# Patient Record
Sex: Female | Born: 1965 | Race: White | Hispanic: No | Marital: Married | Smoking: Never smoker
Health system: Southern US, Community
[De-identification: ages and names within clinical notes are randomized; demographics above are authoritative.]

## PROBLEM LIST (undated history)

## (undated) DIAGNOSIS — I10 Essential (primary) hypertension: Secondary | ICD-10-CM

## (undated) DIAGNOSIS — N3010 Interstitial cystitis (chronic) without hematuria: Secondary | ICD-10-CM

## (undated) DIAGNOSIS — Z8639 Personal history of other endocrine, nutritional and metabolic disease: Secondary | ICD-10-CM

## (undated) HISTORY — DX: Interstitial cystitis (chronic) without hematuria: N30.10

## (undated) HISTORY — DX: Essential (primary) hypertension: I10

## (undated) HISTORY — PX: GALLBLADDER SURGERY: SHX652

## (undated) HISTORY — DX: Personal history of other endocrine, nutritional and metabolic disease: Z86.39

## (undated) HISTORY — PX: WISDOM TOOTH EXTRACTION: SHX21

---

## 1999-04-09 ENCOUNTER — Other Ambulatory Visit: Admission: RE | Admit: 1999-04-09 | Discharge: 1999-04-09 | Payer: Self-pay | Admitting: Gynecology

## 2000-04-22 ENCOUNTER — Other Ambulatory Visit: Admission: RE | Admit: 2000-04-22 | Discharge: 2000-04-22 | Payer: Self-pay | Admitting: Gynecology

## 2000-08-18 ENCOUNTER — Encounter: Admission: RE | Admit: 2000-08-18 | Discharge: 2000-11-16 | Payer: Self-pay | Admitting: Gynecology

## 2000-11-04 ENCOUNTER — Inpatient Hospital Stay (HOSPITAL_COMMUNITY): Admission: AD | Admit: 2000-11-04 | Discharge: 2000-11-06 | Payer: Self-pay | Admitting: Gynecology

## 2000-12-13 ENCOUNTER — Other Ambulatory Visit: Admission: RE | Admit: 2000-12-13 | Discharge: 2000-12-13 | Payer: Self-pay | Admitting: Gynecology

## 2002-02-16 ENCOUNTER — Other Ambulatory Visit: Admission: RE | Admit: 2002-02-16 | Discharge: 2002-02-16 | Payer: Self-pay | Admitting: Gynecology

## 2002-03-29 ENCOUNTER — Encounter: Payer: Self-pay | Admitting: Internal Medicine

## 2002-03-29 ENCOUNTER — Encounter: Admission: RE | Admit: 2002-03-29 | Discharge: 2002-03-29 | Payer: Self-pay | Admitting: Internal Medicine

## 2003-03-05 ENCOUNTER — Other Ambulatory Visit: Admission: RE | Admit: 2003-03-05 | Discharge: 2003-03-05 | Payer: Self-pay | Admitting: Gynecology

## 2003-06-16 ENCOUNTER — Emergency Department (HOSPITAL_COMMUNITY): Admission: EM | Admit: 2003-06-16 | Discharge: 2003-06-16 | Payer: Self-pay | Admitting: Emergency Medicine

## 2003-09-09 ENCOUNTER — Inpatient Hospital Stay (HOSPITAL_COMMUNITY): Admission: AD | Admit: 2003-09-09 | Discharge: 2003-09-11 | Payer: Self-pay | Admitting: Gynecology

## 2003-10-22 ENCOUNTER — Other Ambulatory Visit: Admission: RE | Admit: 2003-10-22 | Discharge: 2003-10-22 | Payer: Self-pay | Admitting: Gynecology

## 2004-01-08 ENCOUNTER — Encounter: Admission: RE | Admit: 2004-01-08 | Discharge: 2004-01-08 | Payer: Self-pay | Admitting: Internal Medicine

## 2004-04-14 ENCOUNTER — Other Ambulatory Visit: Admission: RE | Admit: 2004-04-14 | Discharge: 2004-04-14 | Payer: Self-pay | Admitting: Gynecology

## 2004-10-22 ENCOUNTER — Inpatient Hospital Stay (HOSPITAL_COMMUNITY): Admission: AD | Admit: 2004-10-22 | Discharge: 2004-10-24 | Payer: Self-pay | Admitting: Gynecology

## 2004-10-22 ENCOUNTER — Encounter (INDEPENDENT_AMBULATORY_CARE_PROVIDER_SITE_OTHER): Payer: Self-pay | Admitting: *Deleted

## 2004-12-04 ENCOUNTER — Other Ambulatory Visit: Admission: RE | Admit: 2004-12-04 | Discharge: 2004-12-04 | Payer: Self-pay | Admitting: Gynecology

## 2005-12-17 ENCOUNTER — Other Ambulatory Visit: Admission: RE | Admit: 2005-12-17 | Discharge: 2005-12-17 | Payer: Self-pay | Admitting: Gynecology

## 2007-03-17 ENCOUNTER — Other Ambulatory Visit: Admission: RE | Admit: 2007-03-17 | Discharge: 2007-03-17 | Payer: Self-pay | Admitting: Gynecology

## 2008-03-02 ENCOUNTER — Ambulatory Visit: Payer: Self-pay | Admitting: Gynecology

## 2008-04-11 ENCOUNTER — Encounter: Payer: Self-pay | Admitting: Women's Health

## 2008-04-11 ENCOUNTER — Other Ambulatory Visit: Admission: RE | Admit: 2008-04-11 | Discharge: 2008-04-11 | Payer: Self-pay | Admitting: Gynecology

## 2008-04-11 ENCOUNTER — Ambulatory Visit: Payer: Self-pay | Admitting: Women's Health

## 2008-09-18 ENCOUNTER — Ambulatory Visit (HOSPITAL_COMMUNITY): Admission: RE | Admit: 2008-09-18 | Discharge: 2008-09-18 | Payer: Self-pay | Admitting: Gastroenterology

## 2008-09-18 ENCOUNTER — Encounter (INDEPENDENT_AMBULATORY_CARE_PROVIDER_SITE_OTHER): Payer: Self-pay | Admitting: Gastroenterology

## 2009-04-12 ENCOUNTER — Ambulatory Visit: Payer: Self-pay | Admitting: Women's Health

## 2009-04-12 ENCOUNTER — Other Ambulatory Visit: Admission: RE | Admit: 2009-04-12 | Discharge: 2009-04-12 | Payer: Self-pay | Admitting: Gynecology

## 2009-07-15 ENCOUNTER — Ambulatory Visit (HOSPITAL_COMMUNITY): Admission: RE | Admit: 2009-07-15 | Discharge: 2009-07-15 | Payer: Self-pay | Admitting: Surgery

## 2009-08-22 ENCOUNTER — Ambulatory Visit: Payer: Self-pay | Admitting: Women's Health

## 2010-01-09 ENCOUNTER — Ambulatory Visit: Payer: Self-pay | Admitting: Women's Health

## 2010-02-03 ENCOUNTER — Emergency Department (HOSPITAL_COMMUNITY): Admission: EM | Admit: 2010-02-03 | Discharge: 2010-02-03 | Payer: Self-pay | Admitting: Family Medicine

## 2010-02-04 ENCOUNTER — Encounter: Admission: RE | Admit: 2010-02-04 | Discharge: 2010-02-04 | Payer: Self-pay | Admitting: Gynecology

## 2010-02-13 ENCOUNTER — Encounter: Admission: RE | Admit: 2010-02-13 | Discharge: 2010-02-13 | Payer: Self-pay | Admitting: Gynecology

## 2010-06-25 ENCOUNTER — Other Ambulatory Visit: Payer: Self-pay | Admitting: Women's Health

## 2010-06-25 ENCOUNTER — Encounter (INDEPENDENT_AMBULATORY_CARE_PROVIDER_SITE_OTHER): Payer: 59 | Admitting: Women's Health

## 2010-06-25 ENCOUNTER — Other Ambulatory Visit (HOSPITAL_COMMUNITY)
Admission: RE | Admit: 2010-06-25 | Discharge: 2010-06-25 | Disposition: A | Discharge status: Home / Self Care | Payer: 59 | Source: Ambulatory Visit | Attending: Gynecology | Admitting: Gynecology

## 2010-06-25 DIAGNOSIS — Z01419 Encounter for gynecological examination (general) (routine) without abnormal findings: Secondary | ICD-10-CM

## 2010-06-25 DIAGNOSIS — Z124 Encounter for screening for malignant neoplasm of cervix: Secondary | ICD-10-CM | POA: Insufficient documentation

## 2010-08-04 LAB — URINALYSIS, ROUTINE W REFLEX MICROSCOPIC
Bilirubin Urine: NEGATIVE
Glucose, UA: NEGATIVE mg/dL
Ketones, ur: NEGATIVE mg/dL
pH: 6.5 (ref 5.0–8.0)

## 2010-08-04 LAB — DIFFERENTIAL
Basophils Absolute: 0.1 10*3/uL (ref 0.0–0.1)
Basophils Relative: 2 % — ABNORMAL HIGH (ref 0–1)
Monocytes Relative: 6 % (ref 3–12)
Neutro Abs: 2.2 10*3/uL (ref 1.7–7.7)
Neutrophils Relative %: 53 % (ref 43–77)

## 2010-08-04 LAB — COMPREHENSIVE METABOLIC PANEL
Alkaline Phosphatase: 69 U/L (ref 39–117)
BUN: 11 mg/dL (ref 6–23)
CO2: 29 mEq/L (ref 19–32)
GFR calc non Af Amer: >60 mL/min (ref >60)
Glucose, Bld: 84 mg/dL (ref 70–99)
Potassium: 4 mEq/L (ref 3.5–5.1)
Total Bilirubin: 0.9 mg/dL (ref 0.3–1.2)
Total Protein: 7.5 g/dL (ref 6.0–8.3)

## 2010-08-04 LAB — LIPASE, BLOOD: Lipase: 35 U/L (ref 11–59)

## 2010-08-04 LAB — CBC
HCT: 42 % (ref 36.0–46.0)
Hemoglobin: 14.9 g/dL (ref 12.0–15.0)
RDW: 12 % (ref 11.5–15.5)

## 2010-09-23 NOTE — Op Note (Signed)
NAMEMARIELIS, Faith Miller          ACCOUNT NO.:  [id number]   MEDICAL RECORD NO.:  [id number]          PATIENT TYPE:  AMB   LOCATION:  ENDO                         FACILITY:  [facility]   PHYSICIAN:  Petra Kuba, M.D.    DATE OF BIRTH:  1966-05-07   DATE OF PROCEDURE:  09/18/2008  DATE OF DISCHARGE:                               OPERATIVE REPORT   PROCEDURE PERFORMED:  Colonoscopy with polypectomy.   ENDOSCOPIST:  Petra Kuba, M.D.   INDICATIONS FOR PROCEDURE:  Family history of both colon polyps and  colon cancer, with some rectal pain, probably hemorrhoids.  Consent was  signed after the risks, benefits, methods and options were thoroughly  discussed in the past.   MEDICINES USED:  Fentanyl 100 mcg, Versed 10 mg.   DESCRIPTION OF PROCEDURE:  Rectal inspection was pertinent for small  external hemorrhoids.  Digital exam was negative.  A video pediatric  colonoscope was inserted and on insertion, approximately in the splenic  flexure, a small to medium sized pedunculated polyp was seen, snared,  snared, electrocautery applied and the polyp was removed.  This site had  a nice white coagulum without any residual polyp.  The scope was then  advanced further up the colon leaving the polyp in that area in once  piece and in the midtransverse, a small polyp was seen, snared,  electrocautery applied and the polyp was removed, suctioned through the  scope and collected in the trap.  It was put in the first container.  The scope was then advanced easily to the cecum which was identified by  the appendiceal orifice and the ileocecal valve.  In fact, the scope was  inserted a short ways in the terminal ileum which was normal.  Photodocumentation was obtained.  The scope was slowly withdrawn.  The  prep was adequate.  There was some liquid stool that required washing  and suctioning.  On slow withdrawal through the colon, no other polypoid  lesions, masses, diverticula were seen as we slowly  withdrew back  through the colon.  Both polypectomy sites were seen without bleeding  and a nice white coagulum.  We then found the previously removed polyp  in the descending, grabbed it with the snare a few times and cut through  it, suctioned some of the pieces through it and then grabbed it with the  basket and pulled it through the scope.  We believe we recovered all the  pieces.  We decided to put all the polyp pieces in the same container  with the other small polyp.  Scope was further withdrawn.  No other left-  sided abnormalities were seen as we slowly withdrew back to the rectum.  Once back in the rectum, anorectal pullthrough and retroflexion  confirmed some small hemorrhoids.  Scope was straightened, readvanced a  short ways up the left side of the colon, air was suctioned, scope  removed.  The patient tolerated the procedure well.  There was no  immediate obvious complication.   ENDOSCOPIC DIAGNOSES:  1. Internal and external small hemorrhoids.  2. Approximately level of the splenic flexure, medium pedunculated  polyp status post snare and removed in pieces.  3. Small midtransverse polyp snared.  4. Otherwise within normal limits to the terminal ileum.   PLAN:  Await pathology but probably repeat colon screening in 3 to 5  years.  Happy to see back sooner p.r.n.           ______________________________  Petra Kuba, M.D.     MEM/MEDQ  D:  09/18/2008  T:  09/18/2008  Job:  [id number]

## 2010-09-26 NOTE — Discharge Summary (Signed)
[facility]  Patient:    Faith Miller, Faith Miller                   MRN: [id number] Adm. Date:  [id number] Disc. Date: [id number] Attending:  Tonye Royalty Dictator:   Antony Contras, N.P.                           Discharge Summary  DISCHARGE DIAGNOSES:          Intrauterine pregnancy at 36 weeks, spontaneous rupture of membranes, history of chronic hypertension, depression.  PROCEDURE:                    Vacuum assisted vaginal delivery of viable infant over midline episiotomy.  HISTORY OF PRESENT ILLNESS:   Patient is a 45 year old prima gravida with an EDC of December 03, 2000 by ultrasound.  Prenatal risk factors include history of chronic hypertension, depression.  Patient is on Aldomet 500 mg b.i.d. and Zoloft 100 mg q.d.  PRENATAL LABORATORIES:        Blood type O+.  Antibody screen negative. 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  RPR, HBSAG, HIV nonreactive.  Rubella immune. MSAFP normal.  HOSPITAL COURSE:              Patient presented on November 04, 2000 with spontaneous rupture of membranes at [redacted] weeks gestation.  At that time she was having irregular contractions.  She was admitted to labor and delivery and started on intravenous penicillin-G 5 milliunits for GBS prophylaxis.  Her blood pressure was normal.  Also, admitting laboratory work showed uric acid, LDH, CMET, and blood count all normal.  She did progress to complete dilatation.  Delivered per vacuum assisted vaginal delivery a viable 5 pound 10 ounce, Apgar 8/9 female infant over a midline episiotomy with third degree extension.  Placenta was intact.  Postpartum course patient remained afebrile. Had no difficulty voiding.  Was able to be discharged on her second postpartum day in satisfactory condition.  LABORATORIES:                 CBC:  Hematocrit 26.6, hemoglobin 9.3, WBC 10, platelets 164,000.  DISPOSITION:                  Follow-up in six weeks.  Continue prenatal vitamins and  iron, Motrin and Tylox for pain. DD:  11/26/00 TD:  11/27/00 Job: [id number]

## 2010-09-26 NOTE — H&P (Signed)
[facility]  Patient:    Faith Miller, Faith Miller                   MRN: [id number] Adm. Date:  11/04/00 Attending:  Gaetano Hawthorne. Lily Peer, M.D.                         History and Physical  CHIEF COMPLAINT:              Spontaneous rupture of membranes at approximately 0200 hours.  HISTORY:                      Patient is a 45 year old gravida 1, para 0 with a corrected estimated date of confinement based on early ultrasound of December 03, 2000, patient currently [redacted] weeks gestation and had spontaneous rupture of membranes at 0200 hours this morning [facility] with vital signs as follows:  Temperature 99.5, pulse 102, respirations 20 and blood pressure was 154/100; on repeat, the blood pressure was 136/78, pulse 67 and respirations 18.  She was found to be contracting very irregularly.  Fetal heart rate was 125 to 135 beats per minute.  She was admitted to labor and delivery.  She was started on penicillin G for GBS prophylaxis.  Patient with known history of chronic hypertension during her pregnancy and was on Aldomet 500 mg b.i.d. and also history of depression and was taking Zoloft 100 mg q.d.  She had otherwise an uneventful prenatal course with the exception of close monitoring of her blood pressures and 24-hour urine studies which were normal.  PAST MEDICAL HISTORY:         Patient has history of chronic hypertension and on Aldomet 500 mg b.i.d., history of depression and on Zoloft 100 mg q.d.  ALLERGIES:                    Patient is allergic to ASPIRIN.  FAMILY HISTORY:               See [employer] form.  REVIEW OF SYSTEMS:            See [employer] form.  PHYSICAL EXAMINATION:  VITAL SIGNS:                  As described above.  HEENT:                        Unremarkable.  NECK:                         Supple.  Trachea midline.  No carotid bruits or thyromegaly.  LUNGS:                         Clear to auscultation without rhonchi or wheezes.  HEART:                        Regular rate and rhythm without murmurs or gallops.  BREASTS:                      Exam not done.  ABDOMEN:                      Gravid uterus, vertex presentation by [employer].  PELVIC:  At time of this dictation, patient has reached complete dilatation, complete effacement and 0 to +1 station.  EXTREMITIES:                  DTRs 1+ and no clonus.  Trace edema.  PRENATAL LABORATORY DATA:     O-positive blood type.  Negative antibody screen.  Toxoplasmosis screen with evidence of old disease with old exposure, positive IgG but negative IgM.  VDRL was nonreactive.  Rubella with evidence of immunity.  Hepatitis B surface antigen and HIV were negative.  Pap smear was normal.  Alpha-fetoprotein was normal.  She had an elevated one-hour p.c. blood sugar with a three-hour GTT with one abnormal value.  GBS culture has not been done as of yet.  ASSESSMENT:                   [age]-year-old gravida 1, para 0 with preterm premature rupture of membranes at [redacted] weeks gestation at approximately 0200 hours, was admitted with irregular contractions at approximately 3 oclock this morning and now at 6 a.m., is completely dilated, 0 to +1 station, complete effacement and will to start to push.  She was started on penicillin G 5-million units intravenously followed by 2.5-million units intravenously q.4h. due to her prematurity and not knowing her group B streptococcus status.  Tracing has been reactive and her contractions are more regular, like every one to two minutes apart.  Anticipate a vaginal delivery shortly.  Her blood pressures have been normal and her admitting laboratory work consisting of uric acid, LDH, a comprehensive metabolic panel as well as complete blood count with all normal parameters.  PLAN:                         Per assessment above. DD:  11/04/00 TD:   11/04/00 Job: [id number]

## 2010-12-24 ENCOUNTER — Other Ambulatory Visit: Payer: Self-pay | Admitting: Dermatology

## 2011-06-29 ENCOUNTER — Encounter: Payer: Self-pay | Admitting: Women's Health

## 2011-06-29 ENCOUNTER — Ambulatory Visit (INDEPENDENT_AMBULATORY_CARE_PROVIDER_SITE_OTHER): Payer: Managed Care, Other (non HMO) | Admitting: Women's Health

## 2011-06-29 VITALS — BP 138/88 | Ht 62.5 in | Wt 155.0 lb

## 2011-06-29 DIAGNOSIS — F419 Anxiety disorder, unspecified: Secondary | ICD-10-CM

## 2011-06-29 DIAGNOSIS — I10 Essential (primary) hypertension: Secondary | ICD-10-CM

## 2011-06-29 DIAGNOSIS — Z01419 Encounter for gynecological examination (general) (routine) without abnormal findings: Secondary | ICD-10-CM

## 2011-06-29 DIAGNOSIS — F32A Depression, unspecified: Secondary | ICD-10-CM

## 2011-06-29 DIAGNOSIS — F341 Dysthymic disorder: Secondary | ICD-10-CM

## 2011-06-29 NOTE — Progress Notes (Signed)
Samanatha Miller Oct 06, 1965 [id number]    History:    The patient presents for annual exam.  5-6 day cycle every 30-40 days. Uses natural family planning and spermicide. History of normal Paps and mammograms. Problems of anxiety and depression is currently seeing a psychiatrist. Hypertension primary care labs and meds..  Past medical history, past surgical history, family history and social history were all reviewed and documented in the EPIC chart. Has 3 daughters 2 of which are struggling with school and has them in counseling.   ROS:  A  ROS was performed and pertinent positives and negatives are included in the history.  Exam:  Filed Vitals:   06/29/11 1005  BP: 138/88    General appearance:  Normal Head/Neck:  Normal, without cervical or supraclavicular adenopathy. Thyroid:  Symmetrical, normal in size, without palpable masses or nodularity. Respiratory  Effort:  Normal  Auscultation:  Clear without wheezing or rhonchi Cardiovascular  Auscultation:  Regular rate, without rubs, murmurs or gallops  Edema/varicosities:  Not grossly evident Abdominal  Soft,nontender, without masses, guarding or rebound.  Liver/spleen:  No organomegaly noted  Hernia:  None appreciated  Skin  Inspection:  Grossly normal  Palpation:  Grossly normal Neurologic/psychiatric  Orientation:  Normal with appropriate conversation.  Mood/affect:  Normal  Genitourinary    Breasts: Examined lying and sitting.     Right: Without masses, retractions, discharge or axillary adenopathy.     Left: Without masses, retractions, discharge or axillary adenopathy.   Inguinal/mons:  Normal without inguinal adenopathy  External genitalia:  Normal  BUS/Urethra/Skene's glands:  Normal  Bladder:  Normal  Vagina:  Normal  Cervix:  Normal  Uterus:   normal in size, shape and contour.  Midline and mobile  Adnexa/parametria:     Rt: Without masses or tenderness.   Lt: Without masses or tenderness.  Anus and  perineum: Normal  Digital rectal exam: Normal sphincter tone without palpated masses or tenderness  Assessment/Plan:  46 y.o. M WF G3 P3 for annual exam.   Anxiety/depression-psychiatrist /medications Normal GYN exam Hypertension diagnosed in 2012  Plan: Continue care with primary care and psychiatrist for medications and labs. SBE's, annual mammogram, encouraged increased exercise, calcium rich diet, MVI daily. Contraception options were reviewed and declined,  uses natural family planning with spermicides.   Harrington Challenger WHNP, 2:05 PM 06/29/2011

## 2011-12-25 ENCOUNTER — Other Ambulatory Visit: Payer: Self-pay | Admitting: Women's Health

## 2011-12-25 ENCOUNTER — Other Ambulatory Visit: Payer: Self-pay | Admitting: Gynecology

## 2011-12-25 DIAGNOSIS — Z1231 Encounter for screening mammogram for malignant neoplasm of breast: Secondary | ICD-10-CM

## 2011-12-30 ENCOUNTER — Ambulatory Visit
Admission: RE | Admit: 2011-12-30 | Discharge: 2011-12-30 | Disposition: A | Discharge status: Home / Self Care | Payer: Managed Care, Other (non HMO) | Source: Ambulatory Visit | Attending: Gynecology | Admitting: Gynecology

## 2011-12-30 DIAGNOSIS — Z1231 Encounter for screening mammogram for malignant neoplasm of breast: Secondary | ICD-10-CM

## 2012-03-25 ENCOUNTER — Telehealth: Payer: Self-pay | Admitting: *Deleted

## 2012-03-25 NOTE — Telephone Encounter (Signed)
Pt asked if you could call her she would like to speak with you about her irregular cycle. Call back # [phone].

## 2012-03-25 NOTE — Telephone Encounter (Signed)
Telephone call, states passed a small clot with some fibrous tissue, had a negative U PT. Reviewed normal, denies any cramping or heavy bleeding. Does natural family planning.

## 2012-04-20 ENCOUNTER — Encounter: Payer: Self-pay | Admitting: Cardiology

## 2012-04-20 ENCOUNTER — Ambulatory Visit (INDEPENDENT_AMBULATORY_CARE_PROVIDER_SITE_OTHER): Payer: PRIVATE HEALTH INSURANCE | Admitting: Cardiology

## 2012-04-20 VITALS — BP 145/88 | HR 68 | Ht 62.0 in | Wt 151.8 lb

## 2012-04-20 DIAGNOSIS — R002 Palpitations: Secondary | ICD-10-CM

## 2012-04-20 DIAGNOSIS — I10 Essential (primary) hypertension: Secondary | ICD-10-CM

## 2012-04-20 NOTE — Patient Instructions (Addendum)
Your physician has recommended that you wear an event monitor. Event monitors are medical devices that record the heart's electrical activity. Doctors most often [place] these monitors to diagnose arrhythmias. Arrhythmias are problems with the speed or rhythm of the heartbeat. The monitor is a small, portable device. You can wear one while you do your normal daily activities. This is usually used to diagnose what is causing palpitations/syncope (passing out).    Your physician recommends that you schedule a follow-up appointment in: 4 weeks with Dr. [place].

## 2012-04-20 NOTE — Progress Notes (Signed)
Nickolas Madrid Date of Birth: 10-30-1965 Medical Record #[id number]  History of Present Illness: Faith Miller is seen at the request of Dr. Ludwig Clarks for evaluation of palpitations. She is a pleasant 46 year old white female. She has a history of hypertension. She presents today with predominant complaints of palpitations. She states that she has had palpitations for a long time. She describes this as a shudder in her chest that may even elicit a cough. Over the past several months it has been more frequent. She was started on the Toprol 6 months ago for hypertension. On medication she states that her palpitations are actually worse. She feels more of a pounding sensation and feels that her heart is flipping over. It is more frequent. It is more prominent at night. She also feels fatigued on her beta blocker. Last year she did have some atypical chest pain. She had a echocardiogram performed at Dr. Verl Dicker office. This demonstrated normal LV function. There was mild mitral insufficiency with a structurally normal valve. She apparently was scheduled for stress test but dose was canceled when her blood pressure was too high. She has had no further chest pain since then.  Current Outpatient Prescriptions on File Prior to Visit  Medication Sig Dispense Refill  . ALBUTEROL IN Inhale into the lungs.      . ALPRAZolam (XANAX) 0.25 MG tablet Take 0.25 mg by mouth at bedtime as needed.      . metoprolol succinate (TOPROL-XL) 50 MG 24 hr tablet Take 50 mg by mouth daily. Take with or immediately following a meal.      . Montelukast Sodium (SINGULAIR PO) Take by mouth.      Marland Kitchen PRESCRIPTION MEDICATION ASTHMANEX      . sertraline (ZOLOFT) 25 MG tablet Take 25 mg by mouth daily.        Allergies  Allergen Reactions  . Aspirin Other (See Comments)    Asthma     Past Medical History  Diagnosis Date  . History of elevated lipids   . Anxiety     OBSESSIONS  . Asthma   . Hypertension     Past Surgical  History  Procedure Date  . Wisdom tooth extraction   . Gallbladder surgery     History  Smoking status  . Never Smoker   Smokeless tobacco  . Never Used    History  Alcohol Use  . Yes    Comment: RARELY    Family History  Problem Relation Age of Onset  . Hypertension Mother   . Heart disease Mother   . Hypertension Father   . Heart disease Father   . Breast cancer Maternal Grandmother   . Cancer Paternal Grandmother     COLON    Review of Systems: As noted in history of present illness.  All other systems were reviewed and are negative.  Physical Exam: BP 145/88  Pulse 68  Ht 5\' 2"  (1.575 m)  Wt 151 lb 12.8 oz (68.856 kg)  BMI 27.76 kg/m2 She is a pleasant white female in no acute distress.The patient is alert and oriented x 3.  The mood and affect are normal.  The skin is warm and dry.  Color is normal.  The HEENT exam reveals that the sclera are nonicteric.  The mucous membranes are moist.  The carotids are 2+ without bruits.  There is no thyromegaly.  There is no JVD.  The lungs are clear.  The chest wall is non tender.  The heart exam reveals a  regular rate with a normal S1 and S2.  There is a grade 1-2/6 systolic ejection murmur at the apex radiating to the right upper sternal border.  The PMI is not displaced.   Abdominal exam reveals good bowel sounds.  There is no guarding or rebound.  There is no hepatosplenomegaly or tenderness.  There are no masses.  Exam of the legs reveal no clubbing, cyanosis, or edema.  The legs are without rashes.  The distal pulses are intact.  Cranial nerves II - XII are intact.  Motor and sensory functions are intact.  The gait is normal.  LABORATORY DATA: ECG demonstrates normal sinus rhythm with a normal ECG. Rate is 69 beats per minute.  Assessment / Plan: 1. Palpitations. Symptoms are most consistent with PACs or PVCs. These are likely to be benign but the patient is very anxious about this. We will have her wear an event monitor to  try addendum for her arrhythmia. This will allow [place] to counsel her better. I'll followup again in 4 weeks. Since her symptoms have not improved on metoprolol and in fact are more prominent and may want to consider alternative blood pressure medication.  2. Murmur. She has only mild mitral insufficiency by echo. I suspect that her murmur is more of a flow murmur.  3. Hypertension.

## 2012-04-28 ENCOUNTER — Encounter: Payer: PRIVATE HEALTH INSURANCE | Admitting: *Deleted

## 2012-04-28 DIAGNOSIS — R002 Palpitations: Secondary | ICD-10-CM

## 2012-04-28 NOTE — Progress Notes (Unsigned)
Placed event monitor on Pt 04/28/12. TK

## 2012-05-09 ENCOUNTER — Telehealth: Payer: Self-pay | Admitting: *Deleted

## 2012-05-09 NOTE — Telephone Encounter (Signed)
Event monitor was placed on Pt. 04/28/12. TK

## 2012-06-03 ENCOUNTER — Ambulatory Visit (INDEPENDENT_AMBULATORY_CARE_PROVIDER_SITE_OTHER): Payer: PRIVATE HEALTH INSURANCE | Admitting: Cardiology

## 2012-06-03 ENCOUNTER — Encounter: Payer: Self-pay | Admitting: Cardiology

## 2012-06-03 VITALS — BP 140/90 | HR 70 | Resp 18 | Ht 62.0 in | Wt 151.4 lb

## 2012-06-03 DIAGNOSIS — R002 Palpitations: Secondary | ICD-10-CM

## 2012-06-03 DIAGNOSIS — I10 Essential (primary) hypertension: Secondary | ICD-10-CM

## 2012-06-03 NOTE — Patient Instructions (Signed)
Continue your current therapy  I will see you as needed. 

## 2012-06-03 NOTE — Progress Notes (Signed)
   Nickolas Madrid Date of Birth: 1966/01/02 Medical Record #[id number]  History of Present Illness: Faith Miller is seen  for followup of her palpitations. Since her last visit these have actually improved significantly. She reports only one episode of flip flopping in her chest and this occurred the day after she sent her event monitor in. While wearing the event monitor she had a total of 6 recordings all of which were normal sinus rhythm without ectopy.  Current Outpatient Prescriptions on File Prior to Visit  Medication Sig Dispense Refill  . ALBUTEROL IN Inhale into the lungs.      . ALPRAZolam (XANAX) 0.25 MG tablet Take 0.25 mg by mouth at bedtime as needed.      . metoprolol succinate (TOPROL-XL) 50 MG 24 hr tablet Take 50 mg by mouth daily. Take with or immediately following a meal.      . Montelukast Sodium (SINGULAIR PO) Take by mouth.      Marland Kitchen PRESCRIPTION MEDICATION ASTHMANEX        Allergies  Allergen Reactions  . Aspirin Other (See Comments)    Asthma     Past Medical History  Diagnosis Date  . History of elevated lipids   . Anxiety     OBSESSIONS  . Asthma   . Hypertension     Past Surgical History  Procedure Date  . Wisdom tooth extraction   . Gallbladder surgery     History  Smoking status  . Never Smoker   Smokeless tobacco  . Never Used    History  Alcohol Use  . Yes    Comment: RARELY    Family History  Problem Relation Age of Onset  . Hypertension Mother   . Heart disease Mother   . Hypertension Father   . Heart disease Father   . Breast cancer Maternal Grandmother   . Cancer Paternal Grandmother     COLON    Review of Systems: As noted in history of present illness.  All other systems were reviewed and are negative.  Physical Exam: BP 140/90  Pulse 70  Resp 18  Ht 5\' 2"  (1.575 m)  Wt 151 lb 6.4 oz (68.675 kg)  BMI 27.69 kg/m2  SpO2 97% She is a pleasant white female in no acute distress.The patient is alert and oriented x 3.    The HEENT exam is normal. The carotids are 2+ without bruits.  There is no thyromegaly.  There is no JVD.  The lungs are clear.   The heart exam reveals a regular rate with a normal S1 and S2.  There is a grade 1-2/6 systolic ejection murmur at the apex radiating to the right upper sternal border.  The PMI is not displaced.    LABORATORY DATA:   Assessment / Plan: 1. Palpitations. No significant arrhythmias noted on event monitor. She may be having intermittent PACs or PVCs but her symptoms are very infrequent now. She has normal LV function. I have reassured her that her findings are benign and do not carry any additional risk. I would just try to avoid cardiac stimulants such as caffeine. She will remain on Toprol for blood pressure control. I will see her back as needed.  2. Murmur. She has only mild mitral insufficiency by echo. I suspect that her murmur is more of a flow murmur.  3. Hypertension.

## 2012-06-27 ENCOUNTER — Ambulatory Visit (INDEPENDENT_AMBULATORY_CARE_PROVIDER_SITE_OTHER): Payer: PRIVATE HEALTH INSURANCE | Admitting: Women's Health

## 2012-06-27 DIAGNOSIS — N899 Noninflammatory disorder of vagina, unspecified: Secondary | ICD-10-CM

## 2012-06-27 DIAGNOSIS — N898 Other specified noninflammatory disorders of vagina: Secondary | ICD-10-CM

## 2012-06-27 LAB — WET PREP FOR TRICH, YEAST, CLUE
Trich, Wet Prep: NONE SEEN
Yeast Wet Prep HPF POC: NONE SEEN

## 2012-06-27 NOTE — Progress Notes (Signed)
Patient ID: Faith Miller, female   DOB: [birth year], 47 y.o.   MRN: [id number] Presents with complaint of external vaginal irritation and questionable bump. Denies discharge, odor, urinary symptoms, abdominal pain or fever. Regular monthly cycle/natural family planning. Has been out of work since August, starting a new job next week.  Exam: Appears well, slightly anxious. External genitalia minimal erythema externally, no visible blisters. Speculum exam no discharge noted, wet prep negative. Bimanual no CMT or adnexal fullness or tenderness.  Normal exam  Plan: Normal vaginal anatomy reviewed. Congratulated on new job, reviewed possible anxiety causing symptoms. Loose clothes, instructed to call if symptoms persist. Declines other contraception.

## 2013-01-05 ENCOUNTER — Encounter: Payer: Self-pay | Admitting: Women's Health

## 2013-01-11 ENCOUNTER — Telehealth: Payer: Self-pay | Admitting: Cardiology

## 2013-01-11 NOTE — Telephone Encounter (Signed)
New Problem  Pt states that for the past week she has been stressed and anxious and has noticed skipped beats// pt wants to make sure that she is not at risk or that there isn't anything that she needs to be concerned about.

## 2013-01-11 NOTE — Telephone Encounter (Signed)
Returned call to patient [name] advised to stop caffeine,may take metoprolol succ. 25 mg twice a day.Advised to call back if needed.

## 2013-01-11 NOTE — Telephone Encounter (Signed)
Returned call to patient she stated last week she has had more skipped beats.Stated she has been under more stress and drinking more caffeine.Stated she was concerned and wanted to make sure this is nothing to worry about.Stated in 7/14 she decreased metoprolol succ to 25 mg daily and it was controlling skipped beats.Stated she did call her PCP and was advised to increase metoprolol to 25 mg twice a day.Stated she wanted to check with [name] and get his advice.

## 2013-01-16 ENCOUNTER — Encounter: Payer: Self-pay | Admitting: Women's Health

## 2013-01-16 ENCOUNTER — Ambulatory Visit (INDEPENDENT_AMBULATORY_CARE_PROVIDER_SITE_OTHER): Payer: 59 | Admitting: Women's Health

## 2013-01-16 ENCOUNTER — Other Ambulatory Visit (HOSPITAL_COMMUNITY)
Admission: RE | Admit: 2013-01-16 | Discharge: 2013-01-16 | Disposition: A | Discharge status: Home / Self Care | Payer: PRIVATE HEALTH INSURANCE | Source: Ambulatory Visit | Attending: Gynecology | Admitting: Gynecology

## 2013-01-16 VITALS — BP 126/86 | Ht 62.0 in | Wt 144.8 lb

## 2013-01-16 DIAGNOSIS — Z01419 Encounter for gynecological examination (general) (routine) without abnormal findings: Secondary | ICD-10-CM

## 2013-01-16 DIAGNOSIS — F411 Generalized anxiety disorder: Secondary | ICD-10-CM

## 2013-01-16 MED ORDER — ALPRAZOLAM 0.25 MG PO TABS: 0.2500 mg | ORAL_TABLET | Freq: Every evening | ORAL | Status: DC | PRN

## 2013-01-16 NOTE — Progress Notes (Signed)
Faith Miller [birth year] [id number]    History:    The patient presents for annual exam.  Cycles are mostly monthly/natural family planning declines other contraception. Normal Pap and mammogram history. Atypical nevus on her back, annual skin checks.   Past medical history, past surgical history, family history and social history were all reviewed and documented in the EPIC chart. Works for [employer]. 3 daughters Isabelle Course seventh grade, Samara Deist fourth grade, Duwayne Heck third-grade all doing well. Has had some problems with anxiety and that sessions in the past currently on no medication and denies need. Uses an occasional Xanax. Cholecystectomy. Parents hypertension and heart disease, maternal grandmother breast cancer.   ROS:  A  ROS was performed and pertinent positives and negatives are included in the history.  Exam:  Filed Vitals:   01/16/13 1518  BP: 126/86    General appearance:  Normal Head/Neck:  Normal, without cervical or supraclavicular adenopathy. Thyroid:  Symmetrical, normal in size, without palpable masses or nodularity. Respiratory  Effort:  Normal  Auscultation:  Clear without wheezing or rhonchi Cardiovascular  Auscultation:  Regular rate, without rubs, murmurs or gallops  Edema/varicosities:  Not grossly evident Abdominal  Soft,nontender, without masses, guarding or rebound.  Liver/spleen:  No organomegaly noted  Hernia:  None appreciated  Skin  Inspection:  Grossly normal  Palpation:  Grossly normal Neurologic/psychiatric  Orientation:  Normal with appropriate conversation.  Mood/affect:  Normal  Genitourinary    Breasts: Examined lying and sitting.     Right: Without masses, retractions, discharge or axillary adenopathy.     Left: Without masses, retractions, discharge or axillary adenopathy.   Inguinal/mons:  Normal without inguinal adenopathy  External genitalia:  Normal  BUS/Urethra/Skene's glands:  Normal  Bladder:  Normal  Vagina:   Normal  Cervix:  Normal  Uterus:   normal in size, shape and contour.  Midline and mobile  Adnexa/parametria:     Rt: Without masses or tenderness.   Lt: Without masses or tenderness.  Anus and perineum: Normal  Digital rectal exam: Normal sphincter tone without palpated masses or tenderness  Assessment/Plan:  47 y.o. MWF G3P3 for annual exam with no complaints.  Normal GYN exam natural family planning Atypical nevus/annual skin checks with dermatologist Hypertension primary care manages labs and meds  Anxiety/depression with up sessions currently on no medication  Plan: SBE's, continue annual mammogram, calcium rich diet, regular exercise and healthy diet encouraged. Pap, Pap normal 2012, new screening guidelines reviewed. Contraception options reviewed, declines. Declines need for daily medication for anxiety and depression. Xanax 0.25, prescription, proper use, aware to use sparingly and addictive properties.   Harrington Challenger [facility], 3:58 PM 01/16/2013

## 2013-01-16 NOTE — Addendum Note (Signed)
Addended by: Richardson Chiquito on: 01/16/2013 05:10 PM   Modules accepted: Orders

## 2013-01-16 NOTE — Patient Instructions (Addendum)

## 2013-01-17 ENCOUNTER — Encounter: Payer: Self-pay | Admitting: Gynecology

## 2013-02-01 ENCOUNTER — Telehealth: Payer: Self-pay | Admitting: *Deleted

## 2013-02-01 NOTE — Telephone Encounter (Signed)
Pt said she removed her tampon and she believes some fibers were left inside vaginally. Pt will check vaginally with finger to see if she can feel anything. If any symptoms of fever or headaches, vomiting should occur pt will make OV.

## 2013-02-02 ENCOUNTER — Ambulatory Visit (INDEPENDENT_AMBULATORY_CARE_PROVIDER_SITE_OTHER): Payer: 59 | Admitting: Women's Health

## 2013-02-02 ENCOUNTER — Encounter: Payer: Self-pay | Admitting: Women's Health

## 2013-02-02 DIAGNOSIS — N907 Vulvar cyst: Secondary | ICD-10-CM

## 2013-02-02 DIAGNOSIS — N9089 Other specified noninflammatory disorders of vulva and perineum: Secondary | ICD-10-CM

## 2013-02-02 NOTE — Progress Notes (Signed)
Patient ID: Faith Miller, female   DOB: Jun 24, 1965, 47 y.o.   MRN: [id number] Presents with concern of small white bump left inner labia. Used a tampon yesterday and when removed thought that a piece of it was left behind in vagina. Then noticed some irritation from tampons string and small cyst which was painful to the touch. Denies fever, vaginal or urinary symptoms.  Exam: Appears well, somewhat anxious, external genitalia slightly irritated at introitus, small .5 cm sebaceous cyst on left upper inner labia. Speculum exam: No foreign body noted, no erythema or discharge. Bimanual exam: No CMT, no adnexal tenderness or fullness, no pain with exam.  Small sebaceous cyst Anxiety  Plan: Reassured it is benign and no treatment needed. Recommended soaking in warm bath. Advised to call office if symptoms worsen.

## 2013-02-08 ENCOUNTER — Telehealth: Payer: Self-pay | Admitting: *Deleted

## 2013-02-08 NOTE — Telephone Encounter (Signed)
Soaking in a warm tub, loose clothes, Neosporin to area may also help.

## 2013-02-08 NOTE — Telephone Encounter (Signed)
Pt called to follow up from OV 02/02/13, pt said cyst is starting to hurt off and on, looks like the size may have changed just a little. Pt said she has not tried soaking in warm bath, but will. Pt would like to know if the above is normal? Please advise

## 2013-02-08 NOTE — Telephone Encounter (Signed)
Pt informed with the below note. 

## 2013-06-21 ENCOUNTER — Telehealth: Payer: Self-pay | Admitting: Cardiology

## 2013-06-21 NOTE — Telephone Encounter (Signed)
Returned phone call to patient [name] advised does not need antibiotics before dental work.

## 2013-06-21 NOTE — Telephone Encounter (Signed)
New problem   Pt need to know do she need to take antibiotic before dental procedure. Please call pt.

## 2013-07-26 ENCOUNTER — Ambulatory Visit (INDEPENDENT_AMBULATORY_CARE_PROVIDER_SITE_OTHER): Payer: 59 | Admitting: Women's Health

## 2013-07-26 ENCOUNTER — Encounter: Payer: Self-pay | Admitting: Women's Health

## 2013-07-26 DIAGNOSIS — N898 Other specified noninflammatory disorders of vagina: Secondary | ICD-10-CM

## 2013-07-26 LAB — WET PREP FOR TRICH, YEAST, CLUE
CLUE CELLS WET PREP: NONE SEEN
TRICH WET PREP: NONE SEEN
YEAST WET PREP: NONE SEEN

## 2013-07-26 NOTE — Progress Notes (Signed)
Patient ID: Faith Miller, female   DOB: [birth year], 48 y.o.   MRN: [id number] Presents with complaint of vaginal irritation, mostly left labia with scant white discharge.  States has vaginal irritation most months after her cycle. Uses Aquaphor or over-the-counter Monistat if having itching.  Monthly cycle/rhythm method. Increased stress, daughter with food allergies had to be hospitalized last week. Struggles with anxiety and depression.  Exam: Appears well/anxious. External genitalia erythematous on left labia. Speculum exam, scant discharge, minimal erythema, wet prep negative. Bimanual no CMT or adnexal fullness or tenderness.  Left labia irritation  Plan: Loose clothes, keep open to air as able,  A and D. ointment. Reassurance given, instructed to call if no relief.

## 2013-07-27 ENCOUNTER — Telehealth: Payer: Self-pay | Admitting: Cardiology

## 2013-07-27 NOTE — Telephone Encounter (Signed)
Received fax from Dr.Adornetto's office will ask [name] 07/28/13 if patient needs antibiotic for periodontal dental work.

## 2013-07-27 NOTE — Telephone Encounter (Signed)
New message     It is OK for pt to have peridontial dental work or will she need an antibiotic prior?

## 2013-07-28 NOTE — Telephone Encounter (Signed)
[name] advised patient does not need antibiotics for dental work.Note faxed to Dr.Adornetto at fax # [phone].

## 2013-09-04 ENCOUNTER — Other Ambulatory Visit: Payer: Self-pay

## 2013-09-04 DIAGNOSIS — Z1231 Encounter for screening mammogram for malignant neoplasm of breast: Secondary | ICD-10-CM

## 2013-09-14 ENCOUNTER — Ambulatory Visit
Admission: RE | Admit: 2013-09-14 | Discharge: 2013-09-14 | Disposition: A | Discharge status: Home / Self Care | Payer: 59 | Source: Ambulatory Visit

## 2013-09-14 DIAGNOSIS — Z1231 Encounter for screening mammogram for malignant neoplasm of breast: Secondary | ICD-10-CM

## 2013-12-01 ENCOUNTER — Telehealth: Payer: Self-pay | Admitting: Cardiology

## 2013-12-01 NOTE — Telephone Encounter (Signed)
Returned call to patient [name] advised ok to complete medrol dose pk,,should not cause any cardiac problems.

## 2013-12-01 NOTE — Telephone Encounter (Signed)
Returned call to patient she stated she started Medrol dose pack 11/30/13 for tooth inflammation.stated she will finish medrol on Monday 12/04/13.She was reading side effects and wanted to make sure ok to take.Stated has noticed more palpitations since she started taking.Stated she is taking metoprolol succ 50 mg daily.Message sent to [place] for advice.

## 2013-12-01 NOTE — Telephone Encounter (Signed)
°  Patients PCP put patient on 6 day steroid dose pak for tooth inflammation. She is on day 3 of the dose pak. Her PCP is out of town. She is having a few palpitations nothing serious and wants to make sure that she is not creating a bigger problem by taking this medication. Please call and advise.

## 2013-12-01 NOTE — Telephone Encounter (Signed)
I think its OK to complete medrol dose. I don't think this will cause any cardiac problems.  Jamyria Ozanich [place] MD, [facility]

## 2014-01-17 ENCOUNTER — Encounter: Payer: 59 | Admitting: Women's Health

## 2014-01-23 ENCOUNTER — Encounter (HOSPITAL_COMMUNITY): Payer: Self-pay | Admitting: Emergency Medicine

## 2014-01-23 ENCOUNTER — Emergency Department (HOSPITAL_COMMUNITY)
Admission: EM | Admit: 2014-01-23 | Discharge: 2014-01-23 | Disposition: A | Discharge status: Home / Self Care | Payer: 59 | Source: Home / Self Care | Attending: Family Medicine | Admitting: Family Medicine

## 2014-01-23 DIAGNOSIS — J029 Acute pharyngitis, unspecified: Secondary | ICD-10-CM

## 2014-01-23 LAB — POCT RAPID STREP A: Streptococcus, Group A Screen (Direct): NEGATIVE

## 2014-01-23 NOTE — ED Notes (Signed)
C/o sore throat.  On set last night.  Mild post nasal drip this a.m.  Denies fever, n/v/d.   Has appointment schedule for dentist today wanted to make sure doesn't have strep.

## 2014-01-23 NOTE — Discharge Instructions (Signed)
Your blood pressure is a bit elevated here today. Please have this re-checked by your primary care physician in 7-10 days. Rapid strep screen negative Pharyngitis Pharyngitis is redness, pain, and swelling (inflammation) of your pharynx.  CAUSES  Pharyngitis is usually caused by infection. Most of the time, these infections are from viruses (viral) and are part of a cold. However, sometimes pharyngitis is caused by bacteria (bacterial). Pharyngitis can also be caused by allergies. Viral pharyngitis may be spread from person to person by coughing, sneezing, and personal items or utensils (cups, forks, spoons, toothbrushes). Bacterial pharyngitis may be spread from person to person by more intimate contact, such as kissing.  SIGNS AND SYMPTOMS  Symptoms of pharyngitis include:   Sore throat.   Tiredness (fatigue).   Low-grade fever.   Headache.  Joint pain and muscle aches.  Skin rashes.  Swollen lymph nodes.  Plaque-like film on throat or tonsils (often seen with bacterial pharyngitis). DIAGNOSIS  Your health care provider will ask you questions about your illness and your symptoms. Your medical history, along with a physical exam, is often all that is needed to diagnose pharyngitis. Sometimes, a rapid strep test is done. Other lab tests may also be done, depending on the suspected cause.  TREATMENT  Viral pharyngitis will usually get better in 3-4 days without the use of medicine. Bacterial pharyngitis is treated with medicines that kill germs (antibiotics).  HOME CARE INSTRUCTIONS   Drink enough water and fluids to keep your urine clear or pale yellow.   Only take over-the-counter or prescription medicines as directed by your health care provider:   If you are prescribed antibiotics, make sure you finish them even if you start to feel better.   Do not take aspirin.   Get lots of rest.   Gargle with 8 oz of salt water ( tsp of salt per 1 qt of water) as often as every  1-2 hours to soothe your throat.   Throat lozenges (if you are not at risk for choking) or sprays may be used to soothe your throat. SEEK MEDICAL CARE IF:   You have large, tender lumps in your neck.  You have a rash.  You cough up green, yellow-brown, or bloody spit. SEEK IMMEDIATE MEDICAL CARE IF:   Your neck becomes stiff.  You drool or are unable to swallow liquids.  You vomit or are unable to keep medicines or liquids down.  You have severe pain that does not go away with the use of recommended medicines.  You have trouble breathing (not caused by a stuffy nose). MAKE SURE YOU:   Understand these instructions.  Will watch your condition.  Will get help right away if you are not doing well or get worse. Document Released: 04/27/2005 Document Revised: 02/15/2013 Document Reviewed: 01/02/2013 [facility] Patient Information 2015 [place], [place]. This information is not intended to replace advice given to you by your health care provider. Make sure you discuss any questions you have with your health care provider.

## 2014-01-23 NOTE — ED Provider Notes (Signed)
CSN: [id number]     Arrival date & time 01/23/14  0800 History   First MD Initiated Contact with Patient 01/23/14 0830     Chief Complaint  Patient presents with  . Sore Throat   (Consider location/radiation/quality/duration/timing/severity/associated sxs/prior Treatment) HPI Comments: States symptoms have begun to improve this morning. Is schedule to have dental procedure today and simply wanted to make sure that she does not have strep throat prior to procedure.  Reports herself to be otherwise healthy Non-smoker Works in patient accounting for [employer]  Patient is a 48 y.o. female presenting with pharyngitis. The history is provided by the patient.  Sore Throat This is a new problem. The current episode started yesterday. The problem has been gradually improving. Pertinent negatives include no chest pain, no abdominal pain, no headaches and no shortness of breath.    Past Medical History  Diagnosis Date  . History of elevated lipids   . Anxiety     OBSESSIONS  . Asthma   . Hypertension   . IC (interstitial cystitis)    Past Surgical History  Procedure Laterality Date  . Wisdom tooth extraction    . Gallbladder surgery     Family History  Problem Relation Age of Onset  . Hypertension Mother   . Heart disease Mother   . Hypertension Father   . Heart disease Father   . Breast cancer Maternal Grandmother   . Cancer Paternal Grandmother     COLON   History  Substance Use Topics  . Smoking status: Never Smoker   . Smokeless tobacco: Never Used  . Alcohol Use: Yes     Comment: RARELY   OB History   Grav Para Term Preterm Abortions TAB SAB Ect Mult Living   3 3        3      Review of Systems  Respiratory: Negative for shortness of breath.   Cardiovascular: Negative for chest pain.  Gastrointestinal: Negative for abdominal pain.  Neurological: Negative for headaches.  All other systems reviewed and are negative.   Allergies  Aspirin  Home Medications    Prior to Admission medications   Medication Sig Start Date End Date Taking? Authorizing Provider  metoprolol succinate (TOPROL-XL) 50 MG 24 hr tablet Take 50 mg by mouth daily. Take with or immediately following a meal.   Yes Historical Provider, MD  Montelukast Sodium (SINGULAIR PO) Take by mouth.   Yes Historical Provider, MD  PRESCRIPTION MEDICATION ASMANEX   Yes Historical Provider, MD  ALBUTEROL IN Inhale into the lungs.    Historical Provider, MD  ALPRAZolam Duanne Moron) 0.25 MG tablet Take 1 tablet (0.25 mg total) by mouth at bedtime as needed. 01/16/13   [name], NP   BP 156/96  Pulse 88  Temp(Src) 98.4 F (36.9 C) (Oral)  Resp 12  LMP 01/04/2014 Physical Exam  Nursing note and vitals reviewed. Constitutional: She is oriented to person, place, and time. She appears well-developed and well-nourished. No distress.  HENT:  Head: Normocephalic and atraumatic.  Right Ear: Hearing, tympanic membrane, external ear and ear canal normal.  Left Ear: Hearing, tympanic membrane, external ear and ear canal normal.  Nose: Nose normal.  Mouth/Throat: Uvula is midline, oropharynx is clear and moist and mucous membranes are normal. No oral lesions. No trismus in the jaw. No uvula swelling.  Eyes: Conjunctivae are normal. No scleral icterus.  Neck: Normal range of motion. Neck supple.  Cardiovascular: Normal rate, regular rhythm and normal heart sounds.  Pulmonary/Chest: Effort normal and breath sounds normal.  Lymphadenopathy:    She has no cervical adenopathy.  Neurological: She is alert and oriented to person, place, and time.  Skin: Skin is warm and dry.  Psychiatric: She has a normal mood and affect. Her behavior is normal.    ED Course  Procedures (including critical care time) Labs Review Labs Reviewed  POCT RAPID STREP A ([place])    Imaging Review No results found.   MDM   1. Pharyngitis    Rapid strep negative. Safe to proceed with dental procedure.  Return PRN.     [place], [place] 01/23/14 [phone]

## 2014-01-25 LAB — CULTURE, GROUP A STREP

## 2014-01-26 NOTE — ED Provider Notes (Signed)
Medical screening examination/treatment/procedure(s) were performed by resident physician or non-physician practitioner and as supervising physician I was immediately available for consultation/collaboration.   Pauline Good MD.   Billy Fischer, MD 01/26/14 [phone]

## 2014-01-31 ENCOUNTER — Ambulatory Visit (INDEPENDENT_AMBULATORY_CARE_PROVIDER_SITE_OTHER): Payer: 59 | Admitting: Women's Health

## 2014-01-31 ENCOUNTER — Encounter: Payer: Self-pay | Admitting: Women's Health

## 2014-01-31 VITALS — BP 140/94 | Ht 62.0 in | Wt 142.0 lb

## 2014-01-31 DIAGNOSIS — Z01419 Encounter for gynecological examination (general) (routine) without abnormal findings: Secondary | ICD-10-CM

## 2014-01-31 DIAGNOSIS — F411 Generalized anxiety disorder: Secondary | ICD-10-CM

## 2014-01-31 MED ORDER — ALPRAZOLAM 0.25 MG PO TABS: 0.2500 mg | ORAL_TABLET | Freq: Every evening | ORAL | Status: DC | PRN

## 2014-01-31 NOTE — Patient Instructions (Signed)

## 2014-01-31 NOTE — Progress Notes (Signed)
Faith Miller Nov 28, 1965 [id number]    History:    Presents for annual exam.  Cycles are monthly-getting lighter with more cramping.  Natural family planning declines other contraception. Normal Pap and mammogram history.  PCP manages HTN.  Xanax PRN for anxiety.         Past medical history, past surgical history, family history and social history were all reviewed and documented in the EPIC chart.  Works for [employer]. 3 daughters Faith Miller all doing well. Has had some problems with anxiety, counseling in the past.  Uses an occasional Xanax. Cholecystectomy. Parents hypertension and heart disease, maternal grandmother breast cancer.   ROS:  A  12 point ROS was performed and pertinent positives and negatives are included.  Exam:  Filed Vitals:   01/31/14 1156  BP: 140/94    General appearance:  Normal Thyroid:  Symmetrical, normal in size, without palpable masses or nodularity. Respiratory  Auscultation:  Clear without wheezing or rhonchi Cardiovascular  Auscultation:  Regular rate, without rubs, murmurs or gallops  Edema/varicosities:  Not grossly evident Abdominal  Soft,nontender, without masses, guarding or rebound.  Liver/spleen:  No organomegaly noted  Hernia:  None appreciated  Skin  Inspection:  Grossly normal   Breasts: Examined lying and sitting.     Right: Without masses, retractions, discharge or axillary adenopathy.     Left: Without masses, retractions, discharge or axillary adenopathy. Gentitourinary   Inguinal/mons:  Normal without inguinal adenopathy  External genitalia:  Normal  BUS/Urethra/Skene's glands:  Normal  Vagina:  Normal  Cervix:  Normal  Uterus:  Normal in size, shape and contour.  Midline and mobile  Adnexa/parametria:     Rt: Without masses or tenderness.   Lt: Without masses or tenderness.  Anus and perineum: Normal  Digital rectal exam: Normal sphincter tone without palpated masses or tenderness  Assessment/Plan:   48 y.o. G3P3 for annual exam.     Normal GYN exam/natural family planning  Hypertension- PCP manages labs and meds.   Anxiety- Xanax PRN   Plan: SBE's, continue annual mammogram, calcium rich diet, regular exercise and healthy diet encouraged. Pap normal 2014, new screening guidelines reviewed. Contraception options reviewed, declines. Xanax 0.25mg  1 tab qHS PRN #30 reviewed proper use and addictive properties, aware to use sparingly.   Faith Miller [facility], 12:44 PM 01/31/2014

## 2014-02-01 LAB — URINALYSIS W MICROSCOPIC + REFLEX CULTURE
Bacteria, UA: NONE SEEN
Bilirubin Urine: NEGATIVE
CASTS: NONE SEEN
CRYSTALS: NONE SEEN
GLUCOSE, UA: NEGATIVE mg/dL
Hgb urine dipstick: NEGATIVE
Ketones, ur: NEGATIVE mg/dL
LEUKOCYTES UA: NEGATIVE
Nitrite: NEGATIVE
PH: 6.5 (ref 5.0–8.0)
PROTEIN: NEGATIVE mg/dL
SPECIFIC GRAVITY, URINE: 1.022 (ref 1.005–1.030)
SQUAMOUS EPITHELIAL / LPF: NONE SEEN
Urobilinogen, UA: 0.2 mg/dL (ref 0.0–1.0)

## 2014-02-06 ENCOUNTER — Other Ambulatory Visit: Payer: Self-pay | Admitting: Dermatology

## 2014-03-12 ENCOUNTER — Encounter: Payer: Self-pay | Admitting: Women's Health

## 2014-12-07 ENCOUNTER — Encounter: Payer: Self-pay | Admitting: Women's Health

## 2014-12-07 ENCOUNTER — Ambulatory Visit (INDEPENDENT_AMBULATORY_CARE_PROVIDER_SITE_OTHER): Payer: 59 | Admitting: Women's Health

## 2014-12-07 VITALS — BP 124/80 | Ht 62.0 in | Wt 148.0 lb

## 2014-12-07 DIAGNOSIS — N898 Other specified noninflammatory disorders of vagina: Secondary | ICD-10-CM | POA: Diagnosis not present

## 2014-12-07 DIAGNOSIS — N912 Amenorrhea, unspecified: Secondary | ICD-10-CM | POA: Diagnosis not present

## 2014-12-07 DIAGNOSIS — N920 Excessive and frequent menstruation with regular cycle: Secondary | ICD-10-CM

## 2014-12-07 LAB — PREGNANCY, URINE: PREG TEST UR: NEGATIVE

## 2014-12-07 LAB — WET PREP FOR TRICH, YEAST, CLUE
Clue Cells Wet Prep HPF POC: NONE SEEN
Trich, Wet Prep: NONE SEEN
Yeast Wet Prep HPF POC: NONE SEEN

## 2014-12-07 LAB — TSH: TSH: 2.475 u[IU]/mL (ref 0.350–4.500)

## 2014-12-07 NOTE — Patient Instructions (Signed)

## 2014-12-07 NOTE — Progress Notes (Signed)
Patient ID: Faith Miller, female   DOB: 05-10-1966, 49 y.o.   MRN: [id number] Presents with complaint of irregular bleeding. Continues to have monthly cycle for 4 days but for past 2 months has 3-4 days light spotting prior to the 4 days and then 3-4 days of light spotting after cycle.  The spotting has occurred both times after intercourse, and then cycle starts. Uses the rhythm method for contraception. States is having some difficulty counting days due to cycle changes/perimenopausal. Denies any spotting midcycle. Has skipped a cycle, cycles are usually 24-28 days with an occasional 60 day cycle in the past year. Denies vaginal discharge, odor, urinary symptoms, abdominal pain or fever.  Exam: Appears well. External genitalia within normal limits, speculum exam no visible blood, minimal discharge, wet prep negative. Cervix not friable. Bimanual no CMT or tenderness with exam. UPT negative  Irregular cycle 2 month Perimenopausal  Plan: Keep menstrual calendar if cycles continue to last greater than 7 days will proceed to a sonohysterogram with Dr. Phineas Real. Will check TSH, perimenopause discussed.

## 2015-01-28 ENCOUNTER — Other Ambulatory Visit: Payer: Self-pay

## 2015-01-28 DIAGNOSIS — Z1231 Encounter for screening mammogram for malignant neoplasm of breast: Secondary | ICD-10-CM

## 2015-02-05 ENCOUNTER — Encounter: Payer: Self-pay | Admitting: Women's Health

## 2015-02-05 ENCOUNTER — Other Ambulatory Visit (HOSPITAL_COMMUNITY)
Admission: RE | Admit: 2015-02-05 | Discharge: 2015-02-05 | Disposition: A | Discharge status: Home / Self Care | Payer: 59 | Source: Ambulatory Visit | Attending: Women's Health | Admitting: Women's Health

## 2015-02-05 ENCOUNTER — Ambulatory Visit (INDEPENDENT_AMBULATORY_CARE_PROVIDER_SITE_OTHER): Payer: 59 | Admitting: Women's Health

## 2015-02-05 VITALS — BP 122/86 | Ht 62.0 in | Wt 147.4 lb

## 2015-02-05 DIAGNOSIS — Z01419 Encounter for gynecological examination (general) (routine) without abnormal findings: Secondary | ICD-10-CM | POA: Diagnosis not present

## 2015-02-05 DIAGNOSIS — Z1151 Encounter for screening for human papillomavirus (HPV): Secondary | ICD-10-CM | POA: Diagnosis present

## 2015-02-05 DIAGNOSIS — B373 Candidiasis of vulva and vagina: Secondary | ICD-10-CM | POA: Diagnosis not present

## 2015-02-05 DIAGNOSIS — B3731 Acute candidiasis of vulva and vagina: Secondary | ICD-10-CM

## 2015-02-05 MED ORDER — FLUCONAZOLE 150 MG PO TABS: 150.0000 mg | ORAL_TABLET | Freq: Once | ORAL | Status: DC

## 2015-02-05 NOTE — Patient Instructions (Signed)

## 2015-02-05 NOTE — Progress Notes (Signed)
Faith Miller December 08, 1965 [id number]    History:    Presents for annual exam.  Cycles every 24-48 days most cycles  30 day range. Monthly cycles last year. Occasionally will have light spotting after cycle within the week. Natural family planning. Normal Pap and mammogram history. Hypertension primary care manages. Due for colonoscopy history of colon polyps.  Past medical history, past surgical history, family history and social history were all reviewed and documented in the EPIC chart. Works for [employer]. 3 daughters, Faith Miller, Faith Miller and Faith Miller all doing well. Parents hypertension. Paternal grandmother colon cancer, father colon polyps.  ROS:  A ROS was performed and pertinent positives and negatives are included.  Exam:  Filed Vitals:   02/05/15 1536  BP: 122/86    General appearance:  Normal Thyroid:  Symmetrical, normal in size, without palpable masses or nodularity. Respiratory  Auscultation:  Clear without wheezing or rhonchi Cardiovascular  Auscultation:  Regular rate, without rubs, murmurs or gallops  Edema/varicosities:  Not grossly evident Abdominal  Soft,nontender, without masses, guarding or rebound.  Liver/spleen:  No organomegaly noted  Hernia:  None appreciated  Skin  Inspection:  Grossly normal   Breasts: Examined lying and sitting.     Right: Without masses, retractions, discharge or axillary adenopathy.     Left: Without masses, retractions, discharge or axillary adenopathy. Gentitourinary   Inguinal/mons:  Normal without inguinal adenopathy  External genitalia:  Normal  BUS/Urethra/Skene's glands:  Normal  Vagina:  Normal  Cervix:  Normal  Uterus:  normal in size, shape and contour.  Midline and mobile  Adnexa/parametria:     Rt: Without masses or tenderness.   Lt: Without masses or tenderness.  Anus and perineum: Normal  Digital rectal exam: Normal sphincter tone without palpated masses or tenderness  Assessment/Plan:  49 y.o. MWF G3 P3 for  annual exam with no complaints.  Cycles every 24-48 days/most cycles 30 day range/natural family planning Hypertension-primary care manages labs and meds  Plan: Contraception options reviewed and declined. SBE's, keep scheduled annual mammogram appointment, 3-D reviewed and encouraged. Exercise, calcium rich diet, vitamin D 1000 daily encouraged. Encouraged to decrease calories. UA, Pap with HR HPV typing, new screening guidelines reviewed.  Faith Miller [facility], 4:37 PM 02/05/2015

## 2015-02-07 LAB — CYTOLOGY - PAP

## 2015-02-27 ENCOUNTER — Ambulatory Visit
Admission: RE | Admit: 2015-02-27 | Discharge: 2015-02-27 | Disposition: A | Discharge status: Home / Self Care | Payer: 59 | Source: Ambulatory Visit

## 2015-02-27 DIAGNOSIS — Z1231 Encounter for screening mammogram for malignant neoplasm of breast: Secondary | ICD-10-CM

## 2015-04-24 ENCOUNTER — Ambulatory Visit: Payer: 59 | Admitting: Allergy and Immunology

## 2015-04-25 ENCOUNTER — Other Ambulatory Visit: Payer: Self-pay | Admitting: Gastroenterology

## 2015-05-22 DIAGNOSIS — I119 Hypertensive heart disease without heart failure: Secondary | ICD-10-CM | POA: Diagnosis not present

## 2015-05-22 DIAGNOSIS — E7800 Pure hypercholesterolemia, unspecified: Secondary | ICD-10-CM | POA: Diagnosis not present

## 2015-07-04 ENCOUNTER — Telehealth: Payer: Self-pay | Admitting: Cardiology

## 2015-07-04 NOTE — Telephone Encounter (Signed)
Spoke with pt, reassurance to pt that the echo she has from 2013 did not show MVP just mitral regurg. Usual symptoms of endocarditis, ie: flu like symptoms, fatigue and fevers discussed with the pt. She was referred to her primary care doctor if she has concerns.

## 2015-07-04 NOTE — Telephone Encounter (Signed)
Pt called in wanting to speak with a nurse about a red streak she discovered under the finger nail. She says she googled this and found that Endocarditis was associated with this. She become worried because she just had a mitral valve prolapse but no other symptoms at this time. Please f/u with her   Thanks

## 2015-07-16 ENCOUNTER — Encounter (HOSPITAL_COMMUNITY): Payer: Self-pay | Admitting: *Deleted

## 2015-07-16 DIAGNOSIS — I10 Essential (primary) hypertension: Secondary | ICD-10-CM | POA: Diagnosis not present

## 2015-07-16 DIAGNOSIS — J45909 Unspecified asthma, uncomplicated: Secondary | ICD-10-CM | POA: Insufficient documentation

## 2015-07-16 DIAGNOSIS — Z7951 Long term (current) use of inhaled steroids: Secondary | ICD-10-CM | POA: Diagnosis not present

## 2015-07-16 DIAGNOSIS — M79609 Pain in unspecified limb: Secondary | ICD-10-CM | POA: Diagnosis not present

## 2015-07-16 DIAGNOSIS — M79605 Pain in left leg: Secondary | ICD-10-CM | POA: Diagnosis not present

## 2015-07-16 DIAGNOSIS — Z8639 Personal history of other endocrine, nutritional and metabolic disease: Secondary | ICD-10-CM | POA: Insufficient documentation

## 2015-07-16 DIAGNOSIS — Z87448 Personal history of other diseases of urinary system: Secondary | ICD-10-CM | POA: Insufficient documentation

## 2015-07-16 DIAGNOSIS — Z79899 Other long term (current) drug therapy: Secondary | ICD-10-CM | POA: Diagnosis not present

## 2015-07-16 NOTE — ED Notes (Signed)
Pt c/o left leg pain with pain behind her knee. Concerned that she has a blood clot.

## 2015-07-17 ENCOUNTER — Emergency Department (EMERGENCY_DEPARTMENT_HOSPITAL)
Admit: 2015-07-17 | Discharge: 2015-07-17 | Disposition: A | Discharge status: Home / Self Care | Payer: 59 | Attending: Emergency Medicine | Admitting: Emergency Medicine

## 2015-07-17 ENCOUNTER — Emergency Department (HOSPITAL_COMMUNITY)
Admission: EM | Admit: 2015-07-17 | Discharge: 2015-07-17 | Disposition: A | Discharge status: Home / Self Care | Payer: 59 | Attending: Emergency Medicine | Admitting: Emergency Medicine

## 2015-07-17 DIAGNOSIS — Z79899 Other long term (current) drug therapy: Secondary | ICD-10-CM | POA: Diagnosis not present

## 2015-07-17 DIAGNOSIS — Z8639 Personal history of other endocrine, nutritional and metabolic disease: Secondary | ICD-10-CM | POA: Diagnosis not present

## 2015-07-17 DIAGNOSIS — Z7951 Long term (current) use of inhaled steroids: Secondary | ICD-10-CM | POA: Diagnosis not present

## 2015-07-17 DIAGNOSIS — M79609 Pain in unspecified limb: Secondary | ICD-10-CM

## 2015-07-17 DIAGNOSIS — Z87448 Personal history of other diseases of urinary system: Secondary | ICD-10-CM | POA: Diagnosis not present

## 2015-07-17 DIAGNOSIS — I10 Essential (primary) hypertension: Secondary | ICD-10-CM | POA: Diagnosis not present

## 2015-07-17 DIAGNOSIS — M79605 Pain in left leg: Secondary | ICD-10-CM | POA: Diagnosis not present

## 2015-07-17 DIAGNOSIS — J45909 Unspecified asthma, uncomplicated: Secondary | ICD-10-CM | POA: Diagnosis not present

## 2015-07-17 NOTE — ED Provider Notes (Signed)
CSN: [id number]     Arrival date & time 07/16/15  2242 History   First MD Initiated Contact with Patient 07/17/15 [phone]     Chief Complaint  Patient presents with  . Leg Pain    Faith Miller is a 50 y.o. female Who presents to the emergency department complaining of achy left leg pain behind her knee since last night. Patient reports she was sitting at her house when she began having left leg pain. She reports speaking with the M.D. Line who advised her to come to the emergency department for a rule out of a DVT. She complains of mild pain behind her left knee currently. She describes this as an ache. She has no history of DVTs or PEs. She denies any recent long travel. She denies endogenous estrogen use. She is not a smoker. She denies any personal or close family history of any blood clotting disorders such as factor V Leiden, protein C or S deficiency. She denies chest pain, shortness of breath, abdominal pain, numbness, tingling, weakness or leg injury.   The history is provided by the patient. No language interpreter was used.    Past Medical History  Diagnosis Date  . History of elevated lipids   . Asthma   . Hypertension   . IC (interstitial cystitis)    Past Surgical History  Procedure Laterality Date  . Wisdom tooth extraction    . Gallbladder surgery     Family History  Problem Relation Age of Onset  . Hypertension Mother   . Heart disease Mother   . Hypertension Father   . Heart disease Father   . Breast cancer Maternal Grandmother   . Cancer Paternal Grandmother     COLON   Social History  Substance Use Topics  . Smoking status: Never Smoker   . Smokeless tobacco: Never Used  . Alcohol Use: Yes     Comment: RARELY   OB History    Gravida Para Term Preterm AB TAB SAB Ectopic Multiple Living   3 3        3      Review of Systems  Constitutional: Negative for fever and chills.  HENT: Negative for congestion and sore throat.   Eyes: Negative for visual  disturbance.  Respiratory: Negative for cough, shortness of breath and wheezing.   Cardiovascular: Negative for chest pain, palpitations and leg swelling.  Gastrointestinal: Negative for nausea, vomiting, abdominal pain and diarrhea.  Musculoskeletal: Positive for arthralgias. Negative for back pain and neck pain.  Skin: Negative for rash.  Neurological: Negative for weakness, numbness and headaches.      Allergies  Aspirin  Home Medications   Prior to Admission medications   Medication Sig Start Date End Date Taking? Authorizing Provider  albuterol (PROVENTIL HFA;VENTOLIN HFA) 108 (90 Base) MCG/ACT inhaler Inhale 2 puffs into the lungs every 6 (six) hours as needed for wheezing or shortness of breath.   Yes Historical Provider, MD  ibuprofen (ADVIL,MOTRIN) 200 MG tablet Take 200 mg by mouth every 6 (six) hours as needed for moderate pain.   Yes Historical Provider, MD  metoprolol succinate (TOPROL-XL) 50 MG 24 hr tablet Take 50 mg by mouth daily. Take with or immediately following a meal.   Yes Historical Provider, MD  mometasone (ASMANEX) 220 MCG/INH inhaler Inhale 2 puffs into the lungs daily as needed (shortness of breath).    Yes Historical Provider, MD  montelukast (SINGULAIR) 10 MG tablet Take 10 mg by mouth at bedtime.  Yes Historical Provider, MD   BP 153/75 mmHg  Pulse 67  Temp(Src) 98.6 F (37 C) (Oral)  Resp 18  Ht 5\' 2"  (1.575 m)  Wt 66.225 kg  BMI 26.70 kg/m2  SpO2 99%  LMP 07/10/2015 Physical Exam  Constitutional: She appears well-developed and well-nourished. No distress.  HENT:  Head: Normocephalic and atraumatic.  Mouth/Throat: Oropharynx is clear and moist.  Eyes: Conjunctivae are normal. Pupils are equal, round, and reactive to light. Right eye exhibits no discharge. Left eye exhibits no discharge.  Neck: Neck supple.  Cardiovascular: Normal rate, regular rhythm, normal heart sounds and intact distal pulses.  Exam reveals no gallop and no friction rub.    No murmur heard. Bilateral posterior tibialis and dorsalis pedis pulses are intact.  Pulmonary/Chest: Effort normal and breath sounds normal. No respiratory distress. She has no wheezes. She has no rales.  Abdominal: Soft. There is no tenderness.  Musculoskeletal: Normal range of motion. She exhibits no edema or tenderness.  No calf edema or tenderness bilaterally.  Lymphadenopathy:    She has no cervical adenopathy.  Neurological: She is alert. Coordination normal.  Sensation is intact her bilateral lower extremities.  Skin: Skin is warm and dry. No rash noted. She is not diaphoretic. No erythema. No pallor.  Psychiatric: She has a normal mood and affect. Her behavior is normal.  Nursing note and vitals reviewed.   ED Course  Procedures (including critical care time) Labs Review Labs Reviewed - No data to display  Imaging Review No results found.    EKG Interpretation None      Filed Vitals:   07/17/15 0740 07/17/15 0800 07/17/15 0904 07/17/15 0929  BP: 146/77 145/70 153/75 153/75  Pulse: 68 68 67 67  Temp: 98.6 F (37 C)   98.6 F (37 C)  TempSrc: Oral   Oral  Resp: 18   18  Height:      Weight:      SpO2: 98% 97% 99% 99%     MDM   Meds given in ED:  Medications - No data to display  New Prescriptions   No medications on file    Final diagnoses:  Left leg pain   This is a 50 y.o. female Who presents to the emergency department complaining of achy left leg pain behind her knee since last night. Patient reports she was sitting at her house when she began having left leg pain. She reports speaking with the M.D. Line who advised her to come to the emergency department for a rule out of a DVT. She complains of mild pain behind her left knee currently. She describes this as an ache. No DVT risk factors identified.  On exam the patient is afebrile and nontoxic appearing. She has no calf edema or tenderness. Good pulses. She is neurovascularly intact. Will obtain  DVT study. Left leg DVT study is negative for DVT or Baker's cyst. I advised the patient of these findings. I encouraged her to follow-up closely with her primary care provider and advised she can use Tylenol as needed for pain. I advised the patient to follow-up with their primary care provider this week. I advised the patient to return to the emergency department with new or worsening symptoms or new concerns. The patient verbalized understanding and agreement with plan.      Waynetta Pean, PA-C 07/17/15 [id number]  Everlene Balls, MD 07/17/15 1725

## 2015-07-17 NOTE — Progress Notes (Signed)
VASCULAR LAB PRELIMINARY  PRELIMINARY  PRELIMINARY  PRELIMINARY  Left lower extremity venous duplex completed.    Preliminary report:  Left:  No evidence of DVT, superficial thrombosis, or Baker's cyst.  Kristan Votta, RVT 07/17/2015, 8:51 AM

## 2015-07-17 NOTE — Discharge Instructions (Signed)
Pain Without a Known Cause WHAT IS PAIN WITHOUT A KNOWN CAUSE? Pain can occur in any part of the body and can range from mild to severe. Sometimes no cause can be found for why you are having pain. Some types of pain that can occur without a known cause include:   Headache.  Back pain.  Abdominal pain.  Neck pain. HOW IS PAIN WITHOUT A KNOWN CAUSE DIAGNOSED?  Your health care provider will try to find the cause of your pain. This may include:  Physical exam.  Medical history.  Blood tests.  Urine tests.  X-rays. If no cause is found, your health care provider may diagnose you with pain without a known cause.  IS THERE TREATMENT FOR PAIN WITHOUT A CAUSE?  Treatment depends on the kind of pain you have. Your health care provider may prescribe medicines to help relieve your pain.  WHAT CAN I DO AT HOME FOR MY PAIN?   Take medicines only as directed by your health care provider.  Stop any activities that cause pain. During periods of severe pain, bed rest may help.  Try to reduce your stress with activities such as yoga or meditation. Talk to your health care provider for other stress-reducing activity recommendations.  Exercise regularly, if approved by your health care provider.  Eat a healthy diet that includes fruits and vegetables. This may improve pain. Talk to your health care provider if you have any questions about your diet. WHAT IF MY PAIN DOES NOT GET BETTER?  If you have a painful condition and no reason can be found for the pain or the pain gets worse, it is important to follow up with your health care provider. It may be necessary to repeat tests and look further for a possible cause.    This information is not intended to replace advice given to you by your health care provider. Make sure you discuss any questions you have with your health care provider.   Document Released: 01/20/2001 Document Revised: 05/18/2014 Document Reviewed: 09/12/2013 Elsevier  Interactive Patient Education 2016 [employer] Varicose Veins Varicose veins are veins that have become enlarged and twisted. They are usually seen in the legs but can occur in other parts of the body as well. CAUSES This condition is the result of valves in the veins not working properly. Valves in the veins help to return blood from the leg to the heart. If these valves are damaged, blood flows backward and backs up into the veins in the leg near the skin. This causes the veins to become larger. RISK FACTORS People who are on their feet a lot, who are pregnant, or who are overweight are more likely to develop varicose veins. SIGNS AND SYMPTOMS  Bulging, twisted-appearing, bluish veins, most commonly found on the legs.  Leg pain or a feeling of heaviness. These symptoms may be worse at the end of the day.  Leg swelling.  Changes in skin color. DIAGNOSIS A health care provider can usually diagnose varicose veins by examining your legs. Your health care provider may also recommend an ultrasound of your leg veins. TREATMENT Most varicose veins can be treated at home.However, other treatments are available for people who have persistent symptoms or want to improve the cosmetic appearance of the varicose veins. These treatment options include:  Sclerotherapy. A solution is injected into the vein to close it off.  Laser treatment. A laser is used to heat the vein to close it off.  Radiofrequency vein ablation.  An electrical current produced by radio waves is used to close off the vein.  Phlebectomy. The vein is surgically removed through small incisions made over the varicose vein.  Vein ligation and stripping. The vein is surgically removed through incisions made over the varicose vein after the vein has been tied (ligated). HOME CARE INSTRUCTIONS  Do not stand or sit in one position for long periods of time. Do not sit with your legs crossed. Rest with your legs raised during the  day.  Wear compression stockings as directed by your health care provider. These stockings help to prevent blood clots and reduce swelling in your legs.  Do not wear other tight, encircling garments around your legs, pelvis, or waist.  Walk as much as possible to increase blood flow.  Raise the foot of your bed at night with 2-inch blocks.  If you get a cut in the skin over the vein and the vein bleeds, lie down with your leg raised and press on it with a clean cloth until the bleeding stops. Then place a bandage (dressing) on the cut. See your health care provider if it continues to bleed. SEEK MEDICAL CARE IF:  The skin around your ankle starts to break down.  You have pain, redness, tenderness, or hard swelling in your leg over a vein.  You are uncomfortable because of leg pain.   This information is not intended to replace advice given to you by your health care provider. Make sure you discuss any questions you have with your health care provider.   Document Released: 02/04/2005 Document Revised: 05/18/2014 Document Reviewed: 09/12/2013 Elsevier Interactive Patient Education [employer].

## 2015-07-17 NOTE — ED Notes (Signed)
Patient transported to Ultrasound 

## 2015-07-25 MED FILL — ASMANEX TWISTHALER 220 MCG: 220 | 30 days supply | Qty: 1 | Fill #1

## 2015-07-26 MED FILL — METOPROLOL SUCC ER 50 MG TA: 50 | 90 days supply | Qty: 90 | Fill #0

## 2015-08-09 DIAGNOSIS — J028 Acute pharyngitis due to other specified organisms: Secondary | ICD-10-CM | POA: Diagnosis not present

## 2015-08-12 ENCOUNTER — Telehealth: Payer: Self-pay

## 2015-08-12 NOTE — Telephone Encounter (Signed)
Please inform patient that this reaction could be allergic reaction against the antibiotic or maybe a reaction to the infection itself. The safest thing to do would be to get a substitute for the Suprex from her primary.

## 2015-08-12 NOTE — Telephone Encounter (Signed)
Called patient and gave her the message from Dr. Neldon Mc:         Expand All Collapse All   Please inform patient that this reaction could be allergic reaction against the antibiotic or maybe a reaction to the infection itself. The safest thing to do would be to get a substitute for the Suprex from her primary.

## 2015-08-12 NOTE — Telephone Encounter (Signed)
Patient of Dr. Neldon Mc DOL Visit 10/10/2014. She started an antibiotic on Friday called Suprax 400MG . She woke up Saturday with some itchy bumps on her face. They went away and came back again the next day after taking the antibiotic again. Patient stated she doesn't think its anything serious, but is wondering could it maybe be a allergic reaction. She doesn't;t want to call her Primary because she really thinks this medication is working and doesn't want them to switch it.   Please Advise  Thanks

## 2015-08-12 NOTE — Telephone Encounter (Signed)
Routed to Dr. Neldon Mc

## 2015-08-12 NOTE — Telephone Encounter (Signed)
Called patient left voice mail [phone] (Mobile) *Preferred*  to call back

## 2015-09-15 DIAGNOSIS — S61231A Puncture wound without foreign body of left index finger without damage to nail, initial encounter: Secondary | ICD-10-CM | POA: Diagnosis not present

## 2015-09-20 DIAGNOSIS — R509 Fever, unspecified: Secondary | ICD-10-CM | POA: Diagnosis not present

## 2015-09-20 DIAGNOSIS — R197 Diarrhea, unspecified: Secondary | ICD-10-CM | POA: Diagnosis not present

## 2015-10-11 ENCOUNTER — Encounter: Payer: Self-pay | Admitting: Gynecology

## 2015-10-11 ENCOUNTER — Ambulatory Visit (INDEPENDENT_AMBULATORY_CARE_PROVIDER_SITE_OTHER): Payer: 59 | Admitting: Gynecology

## 2015-10-11 VITALS — BP 128/86

## 2015-10-11 DIAGNOSIS — N898 Other specified noninflammatory disorders of vagina: Secondary | ICD-10-CM | POA: Diagnosis not present

## 2015-10-11 LAB — WET PREP FOR TRICH, YEAST, CLUE
CLUE CELLS WET PREP: NONE SEEN
TRICH WET PREP: NONE SEEN
YEAST WET PREP: NONE SEEN

## 2015-10-11 NOTE — Progress Notes (Signed)
   HPI: Patient is a 50 year old that presented to the office as a result of a lesion that she had noted on her left labia minora and also questionable vaginal discharge. Patient stated approximate month ago she was treated with Suprax for a throat infection and then following that her dentist had placed her on clindamycin for tooth infection and she developed C. difficile and has just recently completed a 10 day course of Flagyl and she's currently taking a probiotic tablet daily as well. She is in a monogamous relationship. She denies any fever, chills, nausea, vomiting or any back pain. No change in appetite and no GI complaints. She is using rhythm for contraception and is in a monogamous relation.   ROS: A ROS was performed and pertinent positives and negatives are included in the history.  GENERAL: No fevers or chills. HEENT: No change in vision, no earache, sore throat or sinus congestion. NECK: No pain or stiffness. CARDIOVASCULAR: No chest pain or pressure. No palpitations. PULMONARY: No shortness of breath, cough or wheeze. GASTROINTESTINAL: No abdominal pain, nausea, vomiting or diarrhea, melena or bright red blood per rectum. GENITOURINARY: No urinary frequency, urgency, hesitancy or dysuria. MUSCULOSKELETAL: No joint or muscle pain, no back pain, no recent trauma. DERMATOLOGIC: No rash, no itching, no lesions. ENDOCRINE: No polyuria, polydipsia, no heat or cold intolerance. No recent change in weight. HEMATOLOGICAL: No anemia or easy bruising or bleeding. NEUROLOGIC: No headache, seizures, numbness, tingling or weakness. PSYCHIATRIC: No depression, no loss of interest in normal activity or change in sleep pattern.   Physical Exam  Genitourinary:      Wet prep no yeast, rare WBC, rare bacteria   Assessment Plan: Patient with a small simple labia minora (left) inclusion cyst. Patient was reassured this is is benign no treatment is indicated. Her wet prep was negative. Patient scheduled for  her annual exam at the end of the year.    Greater than 50% of time was spent in counseling and coordinating care of this patient.   Time of consultation: 15   Minutes.

## 2015-10-17 DIAGNOSIS — I119 Hypertensive heart disease without heart failure: Secondary | ICD-10-CM | POA: Diagnosis not present

## 2015-10-17 DIAGNOSIS — Z0000 Encounter for general adult medical examination without abnormal findings: Secondary | ICD-10-CM | POA: Diagnosis not present

## 2015-10-17 DIAGNOSIS — M940 Chondrocostal junction syndrome [Tietze]: Secondary | ICD-10-CM | POA: Diagnosis not present

## 2015-10-17 DIAGNOSIS — Z6824 Body mass index (BMI) 24.0-24.9, adult: Secondary | ICD-10-CM | POA: Diagnosis not present

## 2015-10-17 DIAGNOSIS — E7800 Pure hypercholesterolemia, unspecified: Secondary | ICD-10-CM | POA: Diagnosis not present

## 2015-10-28 MED FILL — MONTELUKAST SOD 10 MG TAB: 10 | 90 days supply | Qty: 90 | Fill #0

## 2015-10-28 MED FILL — METOPROLOL SUCC ER 50 MG TA: 50 | 90 days supply | Qty: 90 | Fill #1

## 2016-01-17 MED FILL — METOPROLOL SUCC ER 50 MG TA: 50 | 90 days supply | Qty: 90 | Fill #2

## 2016-02-06 ENCOUNTER — Ambulatory Visit (INDEPENDENT_AMBULATORY_CARE_PROVIDER_SITE_OTHER): Payer: 59 | Admitting: Women's Health

## 2016-02-06 ENCOUNTER — Encounter: Payer: Self-pay | Admitting: Women's Health

## 2016-02-06 VITALS — BP 150/84 | Ht 62.5 in | Wt 135.6 lb

## 2016-02-06 DIAGNOSIS — Z01419 Encounter for gynecological examination (general) (routine) without abnormal findings: Secondary | ICD-10-CM | POA: Diagnosis not present

## 2016-02-06 NOTE — Patient Instructions (Signed)
Health Maintenance, Female Adopting a healthy lifestyle and getting preventive care can go a long way to promote health and wellness. Talk with your health care provider about what schedule of regular examinations is right for you. This is a good chance for you to check in with your provider about disease prevention and staying healthy. In between checkups, there are plenty of things you can do on your own. Experts have done a lot of research about which lifestyle changes and preventive measures are most likely to keep you healthy. Ask your health care provider for more information. WEIGHT AND DIET  Eat a healthy diet  Be sure to include plenty of vegetables, fruits, low-fat dairy products, and lean protein.  Do not eat a lot of foods high in solid fats, added sugars, or salt.  Get regular exercise. This is one of the most important things you can do for your health.  Most adults should exercise for at least 150 minutes each week. The exercise should increase your heart rate and make you sweat (moderate-intensity exercise).  Most adults should also do strengthening exercises at least twice a week. This is in addition to the moderate-intensity exercise.  Maintain a healthy weight  Body mass index (BMI) is a measurement that can be used to identify possible weight problems. It estimates body fat based on height and weight. Your health care provider can help determine your BMI and help you achieve or maintain a healthy weight.  For females 20 years of age and older:   A BMI below 18.5 is considered underweight.  A BMI of 18.5 to 24.9 is normal.  A BMI of 25 to 29.9 is considered overweight.  A BMI of 30 and above is considered obese.  Watch levels of cholesterol and blood lipids  You should start having your blood tested for lipids and cholesterol at 48 years of age, then have this test every 5 years.  You may need to have your cholesterol levels checked more often if:  Your lipid  or cholesterol levels are high.  You are older than 48 years of age.  You are at high risk for heart disease.  CANCER SCREENING   Lung Cancer  Lung cancer screening is recommended for adults 55-80 years old who are at high risk for lung cancer because of a history of smoking.  A yearly low-dose CT scan of the lungs is recommended for people who:  Currently smoke.  Have quit within the past 15 years.  Have at least a 30-pack-year history of smoking. A pack year is smoking an average of one pack of cigarettes a day for 1 year.  Yearly screening should continue until it has been 15 years since you quit.  Yearly screening should stop if you develop a health problem that would prevent you from having lung cancer treatment.  Breast Cancer  Practice breast self-awareness. This means understanding how your breasts normally appear and feel.  It also means doing regular breast self-exams. Let your health care provider know about any changes, no matter how small.  If you are in your 20s or 30s, you should have a clinical breast exam (CBE) by a health care provider every 1-3 years as part of a regular health exam.  If you are 40 or older, have a CBE every year. Also consider having a breast X-ray (mammogram) every year.  If you have a family history of breast cancer, talk to your health care provider about genetic screening.  If you   are at high risk for breast cancer, talk to your health care provider about having an MRI and a mammogram every year.  Breast cancer gene (BRCA) assessment is recommended for women who have family members with BRCA-related cancers. BRCA-related cancers include:  Breast.  Ovarian.  Tubal.  Peritoneal cancers.  Results of the assessment will determine the need for genetic counseling and BRCA1 and BRCA2 testing. Cervical Cancer Your health care provider may recommend that you be screened regularly for cancer of the pelvic organs (ovaries, uterus, and  vagina). This screening involves a pelvic examination, including checking for microscopic changes to the surface of your cervix (Pap test). You may be encouraged to have this screening done every 3 years, beginning at age 21.  For women ages 30-65, health care providers may recommend pelvic exams and Pap testing every 3 years, or they may recommend the Pap and pelvic exam, combined with testing for human papilloma virus (HPV), every 5 years. Some types of HPV increase your risk of cervical cancer. Testing for HPV may also be done on women of any age with unclear Pap test results.  Other health care providers may not recommend any screening for nonpregnant women who are considered low risk for pelvic cancer and who do not have symptoms. Ask your health care provider if a screening pelvic exam is right for you.  If you have had past treatment for cervical cancer or a condition that could lead to cancer, you need Pap tests and screening for cancer for at least 20 years after your treatment. If Pap tests have been discontinued, your risk factors (such as having a new sexual partner) need to be reassessed to determine if screening should resume. Some women have medical problems that increase the chance of getting cervical cancer. In these cases, your health care provider may recommend more frequent screening and Pap tests. Colorectal Cancer  This type of cancer can be detected and often prevented.  Routine colorectal cancer screening usually begins at 48 years of age and continues through 48 years of age.  Your health care provider may recommend screening at an earlier age if you have risk factors for colon cancer.  Your health care provider may also recommend using home test kits to check for hidden blood in the stool.  A small camera at the end of a tube can be used to examine your colon directly (sigmoidoscopy or colonoscopy). This is done to check for the earliest forms of colorectal  cancer.  Routine screening usually begins at age 50.  Direct examination of the colon should be repeated every 5-10 years through 48 years of age. However, you may need to be screened more often if early forms of precancerous polyps or small growths are found. Skin Cancer  Check your skin from head to toe regularly.  Tell your health care provider about any new moles or changes in moles, especially if there is a change in a mole's shape or color.  Also tell your health care provider if you have a mole that is larger than the size of a pencil eraser.  Always use sunscreen. Apply sunscreen liberally and repeatedly throughout the day.  Protect yourself by wearing long sleeves, pants, a wide-brimmed hat, and sunglasses whenever you are outside. HEART DISEASE, DIABETES, AND HIGH BLOOD PRESSURE   High blood pressure causes heart disease and increases the risk of stroke. High blood pressure is more likely to develop in:  People who have blood pressure in the high end   of the normal range (130-139/85-89 mm Hg).  People who are overweight or obese.  People who are African American.  If you are 38-23 years of age, have your blood pressure checked every 3-5 years. If you are 61 years of age or older, have your blood pressure checked every year. You should have your blood pressure measured twice--once when you are at a hospital or clinic, and once when you are not at a hospital or clinic. Record the average of the two measurements. To check your blood pressure when you are not at a hospital or clinic, you can use:  An automated blood pressure machine at a pharmacy.  A home blood pressure monitor.  If you are between 45 years and 39 years old, ask your health care provider if you should take aspirin to prevent strokes.  Have regular diabetes screenings. This involves taking a blood sample to check your fasting blood sugar level.  If you are at a normal weight and have a low risk for diabetes,  have this test once every three years after 48 years of age.  If you are overweight and have a high risk for diabetes, consider being tested at a younger age or more often. PREVENTING INFECTION  Hepatitis B  If you have a higher risk for hepatitis B, you should be screened for this virus. You are considered at high risk for hepatitis B if:  You were born in a country where hepatitis B is common. Ask your health care provider which countries are considered high risk.  Your parents were born in a high-risk country, and you have not been immunized against hepatitis B (hepatitis B vaccine).  You have HIV or AIDS.  You use needles to inject street drugs.  You live with someone who has hepatitis B.  You have had sex with someone who has hepatitis B.  You get hemodialysis treatment.  You take certain medicines for conditions, including cancer, organ transplantation, and autoimmune conditions. Hepatitis C  Blood testing is recommended for:  Everyone born from [birth year] through 1963.  Anyone with known risk factors for hepatitis C. Sexually transmitted infections (STIs)  You should be screened for sexually transmitted infections (STIs) including gonorrhea and chlamydia if:  You are sexually active and are younger than 48 years of age.  You are older than 48 years of age and your health care provider tells you that you are at risk for this type of infection.  Your sexual activity has changed since you were last screened and you are at an increased risk for chlamydia or gonorrhea. Ask your health care provider if you are at risk.  If you do not have HIV, but are at risk, it may be recommended that you take a prescription medicine daily to prevent HIV infection. This is called pre-exposure prophylaxis (PrEP). You are considered at risk if:  You are sexually active and do not regularly use condoms or know the HIV status of your partner(s).  You take drugs by injection.  You are sexually  active with a partner who has HIV. Talk with your health care provider about whether you are at high risk of being infected with HIV. If you choose to begin PrEP, you should first be tested for HIV. You should then be tested every 3 months for as long as you are taking PrEP.  PREGNANCY   If you are premenopausal and you may become pregnant, ask your health care provider about preconception counseling.  If you may  become pregnant, take 400 to 800 micrograms (mcg) of folic acid every day.  If you want to prevent pregnancy, talk to your health care provider about birth control (contraception). OSTEOPOROSIS AND MENOPAUSE   Osteoporosis is a disease in which the bones lose minerals and strength with aging. This can result in serious bone fractures. Your risk for osteoporosis can be identified using a bone density scan.  If you are 61 years of age or older, or if you are at risk for osteoporosis and fractures, ask your health care provider if you should be screened.  Ask your health care provider whether you should take a calcium or vitamin D supplement to lower your risk for osteoporosis.  Menopause may have certain physical symptoms and risks.  Hormone replacement therapy may reduce some of these symptoms and risks. Talk to your health care provider about whether hormone replacement therapy is right for you.  HOME CARE INSTRUCTIONS   Schedule regular health, dental, and eye exams.  Stay current with your immunizations.   Do not use any tobacco products including cigarettes, chewing tobacco, or electronic cigarettes.  If you are pregnant, do not drink alcohol.  If you are breastfeeding, limit how much and how often you drink alcohol.  Limit alcohol intake to no more than 1 drink per day for nonpregnant women. One drink equals 12 ounces of beer, 5 ounces of wine, or 1 ounces of hard liquor.  Do not use street drugs.  Do not share needles.  Ask your health care provider for help if  you need support or information about quitting drugs.  Tell your health care provider if you often feel depressed.  Tell your health care provider if you have ever been abused or do not feel safe at home.   This information is not intended to replace advice given to you by your health care provider. Make sure you discuss any questions you have with your health care provider.   Document Released: 11/10/2010 Document Revised: 05/18/2014 Document Reviewed: 03/29/2013 Elsevier Interactive Patient Education [employer].

## 2016-02-06 NOTE — Progress Notes (Signed)
Faith Miller 11-16-1965 [id number]    History:    Presents for annual exam.  Cycles ranging between 25 and 40 days using natural family planning. Normal Pap and mammogram history. Hypertension primary care manages. Has lost 15 pounds in the past year after C. difficile from antibiotics for tooth abscess. History of anxiety and depression doing better.  Past medical history, past surgical history, family history and social history were all reviewed and documented in the EPIC chart. Works for [employer]. Father colon polyps. Parents hypertension. Has 3 daughters one in high school, 2 in middle school oldest has received gardasil .  ROS:  A ROS was performed and pertinent positives and negatives are included.  Exam:  Vitals:   02/06/16 1420  BP: (!) 150/84  Weight: 135 lb 9.6 oz (61.5 kg)  Height: 5' 2.5" (1.588 m)   Body mass index is 24.41 kg/m.   General appearance:  Normal Thyroid:  Symmetrical, normal in size, without palpable masses or nodularity. Respiratory  Auscultation:  Clear without wheezing or rhonchi Cardiovascular  Auscultation:  Regular rate, without rubs, murmurs or gallops  Edema/varicosities:  Not grossly evident Abdominal  Soft,nontender, without masses, guarding or rebound.  Liver/spleen:  No organomegaly noted  Hernia:  None appreciated  Skin  Inspection:  Grossly normal   Breasts: Examined lying and sitting.     Right: Without masses, retractions, discharge or axillary adenopathy.     Left: Without masses, retractions, discharge or axillary adenopathy. Gentitourinary   Inguinal/mons:  Normal without inguinal adenopathy  External genitalia:  Normal  BUS/Urethra/Skene's glands:  Normal  Vagina:  Normal  Cervix:  Normal  Uterus:   normal in size, shape and contour.  Midline and mobile  Adnexa/parametria:     Rt: Without masses or tenderness.   Lt: Without masses or tenderness.  Anus and perineum: Normal  Digital rectal exam: Normal sphincter  tone without palpated masses or tenderness  Assessment/Plan:  50 y.o. MWF G3 P3 for annual exam.   Monthly cycles/natural family planning Hypertension-primary care manages labs and meds  Plan: Reviewed blood pressure elevated today, instructed to check at home and follow-up with primary care for possible medical administration. Encouraged low-salt diet, less caffeine. Contraception reviewed declines will continue natural family planning. SBE's, continue annual screening mammogram due in October. Encouraged regular daily walking, leisure activities, calcium rich diet, vitamin D 1000 daily encouraged. UA, Pap normal 2016, new screening guidelines reviewed.   [name] WHNP, 3:11 PM 02/06/2016

## 2016-02-07 LAB — URINALYSIS W MICROSCOPIC + REFLEX CULTURE
Bacteria, UA: NONE SEEN [HPF]
Bilirubin Urine: NEGATIVE
Casts: NONE SEEN [LPF]
Crystals: NONE SEEN [HPF]
GLUCOSE, UA: NEGATIVE
Hgb urine dipstick: NEGATIVE
Ketones, ur: NEGATIVE
LEUKOCYTES UA: NEGATIVE
NITRITE: NEGATIVE
PH: 6.5 (ref 5.0–8.0)
Protein, ur: NEGATIVE
RBC / HPF: NONE SEEN RBC/HPF (ref <=2)
SPECIFIC GRAVITY, URINE: 1.019 (ref 1.001–1.035)
Squamous Epithelial / LPF: NONE SEEN [HPF] (ref <=5)
WBC, UA: NONE SEEN WBC/HPF (ref <=5)
Yeast: NONE SEEN [HPF]

## 2016-02-27 ENCOUNTER — Other Ambulatory Visit: Payer: Self-pay | Admitting: Women's Health

## 2016-02-27 DIAGNOSIS — Z1231 Encounter for screening mammogram for malignant neoplasm of breast: Secondary | ICD-10-CM

## 2016-03-10 ENCOUNTER — Encounter: Payer: Self-pay | Admitting: Women's Health

## 2016-03-10 ENCOUNTER — Ambulatory Visit (INDEPENDENT_AMBULATORY_CARE_PROVIDER_SITE_OTHER): Payer: 59 | Admitting: Women's Health

## 2016-03-10 VITALS — BP 150/80 | Ht 62.0 in | Wt 135.0 lb

## 2016-03-10 DIAGNOSIS — N898 Other specified noninflammatory disorders of vagina: Secondary | ICD-10-CM | POA: Diagnosis not present

## 2016-03-10 DIAGNOSIS — R350 Frequency of micturition: Secondary | ICD-10-CM

## 2016-03-10 LAB — URINALYSIS W MICROSCOPIC + REFLEX CULTURE
BILIRUBIN URINE: NEGATIVE
Bacteria, UA: NONE SEEN [HPF]
CASTS: NONE SEEN [LPF]
Crystals: NONE SEEN [HPF]
GLUCOSE, UA: NEGATIVE
Ketones, ur: NEGATIVE
LEUKOCYTES UA: NEGATIVE
NITRITE: NEGATIVE
PH: 5.5 (ref 5.0–8.0)
Protein, ur: NEGATIVE
RBC / HPF: NONE SEEN RBC/HPF (ref <=2)
SPECIFIC GRAVITY, URINE: 1.025 (ref 1.001–1.035)
WBC UA: NONE SEEN WBC/HPF (ref <=5)
Yeast: NONE SEEN [HPF]

## 2016-03-10 LAB — WET PREP FOR TRICH, YEAST, CLUE
Clue Cells Wet Prep HPF POC: NONE SEEN
Trich, Wet Prep: NONE SEEN
Yeast Wet Prep HPF POC: NONE SEEN

## 2016-03-10 NOTE — Patient Instructions (Signed)
A&D ointment

## 2016-03-10 NOTE — Progress Notes (Signed)
Presents with complaint of vaginal irritation/discomfort for the past week noted after intercourse. Denies dyspareunia, itching or odor with discharge, abdominal pain or fever. Mild  pain with urination without  burning.  Cycles every 25-48 days using natural family planning. Hypertension primary care manages. Had C. difficile this past year but doing better.  Exam: Appears well. External genitalia within normal limits, mild erythema at introitus, speculum exam scant clear discharge without erythema, odor, wet prep negative. Bimanual no CMT or adnexal tenderness. UA: Negative leukocytes, trace blood, no wbc's, no RBCs, no bacteria.  Vaginal irritation  Plan: Reviewed perimenopausal changes of vaginal dryness, encouraged vaginal lubricants with intercourse. Loose clothing, open to air as able. Instructed to call if continued problems.

## 2016-03-17 ENCOUNTER — Ambulatory Visit
Admission: RE | Admit: 2016-03-17 | Discharge: 2016-03-17 | Disposition: A | Discharge status: Home / Self Care | Payer: 59 | Source: Ambulatory Visit | Attending: Women's Health | Admitting: Women's Health

## 2016-03-17 ENCOUNTER — Telehealth: Payer: Self-pay | Admitting: *Deleted

## 2016-03-17 DIAGNOSIS — Z1231 Encounter for screening mammogram for malignant neoplasm of breast: Secondary | ICD-10-CM

## 2016-03-17 NOTE — Telephone Encounter (Signed)
Pt was seen on 03/10/16 c/o vaginal irritation/discomfort no improvement still the same, pt said she is using Aquaphor because she had this on hand, pt has not used A & D ointment. Should pt start A & D ointment and call if no better in 1 week? Please advise

## 2016-03-17 NOTE — Telephone Encounter (Signed)
TC states better today, had more irritation yesterday. We'll continue to watch, loose clothing. Having no itching, odor or urinary symptoms.

## 2016-03-19 ENCOUNTER — Encounter: Payer: Self-pay | Admitting: Women's Health

## 2016-03-31 ENCOUNTER — Ambulatory Visit (INDEPENDENT_AMBULATORY_CARE_PROVIDER_SITE_OTHER): Payer: 59 | Admitting: Women's Health

## 2016-03-31 ENCOUNTER — Encounter: Payer: Self-pay | Admitting: Women's Health

## 2016-03-31 VITALS — BP 158/84 | Ht 62.0 in | Wt 135.0 lb

## 2016-03-31 DIAGNOSIS — R350 Frequency of micturition: Secondary | ICD-10-CM | POA: Diagnosis not present

## 2016-03-31 DIAGNOSIS — N898 Other specified noninflammatory disorders of vagina: Secondary | ICD-10-CM

## 2016-03-31 LAB — URINALYSIS W MICROSCOPIC + REFLEX CULTURE
BACTERIA UA: NONE SEEN [HPF]
BILIRUBIN URINE: NEGATIVE
CRYSTALS: NONE SEEN [HPF]
Casts: NONE SEEN [LPF]
GLUCOSE, UA: NEGATIVE
Ketones, ur: NEGATIVE
LEUKOCYTES UA: NEGATIVE
Nitrite: NEGATIVE
PH: 6 (ref 5.0–8.0)
PROTEIN: NEGATIVE
RBC / HPF: NONE SEEN RBC/HPF (ref <=2)
Specific Gravity, Urine: 1.02 (ref 1.001–1.035)
WBC UA: NONE SEEN WBC/HPF (ref <=5)
Yeast: NONE SEEN [HPF]

## 2016-03-31 LAB — WET PREP FOR TRICH, YEAST, CLUE
CLUE CELLS WET PREP: NONE SEEN
Trich, Wet Prep: NONE SEEN
WBC WET PREP: NONE SEEN
Yeast Wet Prep HPF POC: NONE SEEN

## 2016-03-31 MED ORDER — SULFAMETHOXAZOLE-TRIMETHOPRIM 800-160 MG PO TABS: 1.0000 | ORAL_TABLET | Freq: Two times a day (BID) | ORAL | 0 refills | Status: DC

## 2016-03-31 MED ORDER — PHENAZOPYRIDINE HCL 100 MG PO TABS: 100.0000 mg | ORAL_TABLET | Freq: Three times a day (TID) | ORAL | 0 refills | Status: DC | PRN

## 2016-03-31 NOTE — Progress Notes (Signed)
Presents with complaint of increased perineal discomfort, irritation with occasional itching more near the clitoral hood. Questions if she has urethritis, has occasional burning sensation with urination, worried that she may need antibiotic had C. difficile this past year. States discomfort noted in the vaginal area at office visit October 31 less problematic. States has had some anxiety over continuing symptoms. Cycling monthly uses natural family planning for contraception. Denies pain with urination, abdominal pain or fever.  Exam: Appears well but worried. Abdomen soft without rebound or radiation of pain. External genitalia mild erythema on labia minora, no visible erythema at clitoral hood or urethra, less than a half centimeter sebaceous cyst up her and of left labia minora. wet prep negative. UA negative  Perineal discomfort History of C. difficile  Plan: Reviewed normality of wet prep, UA, exam. Assurance given. Urine culture pending. We'll continue a and D ointment externally which has helped. Instructed to call if continued symptoms. Prescription for Pyridium 100 mg to take 3 times daily if needed.

## 2016-03-31 NOTE — Patient Instructions (Signed)
Urethritis, Adult Urethritis is an inflammation of the tube through which urine exits your bladder (urethra). What are the causes? Urethritis is often caused by an infection in your urethra. The infection can be viral, like herpes. The infection can also be bacterial, like gonorrhea. What increases the risk? Risk factors of urethritis include:  Having sex without using a condom.  Having multiple sexual partners.  Having poor hygiene.  What are the signs or symptoms? Symptoms of urethritis are less noticeable in women than in men. These symptoms include:  Burning feeling when you urinate (dysuria).  Discharge from your urethra.  Blood in your urine (hematuria).  Urinating more than usual.  How is this diagnosed? To confirm a diagnosis of urethritis, your health care provider will do the following:  Ask about your sexual history.  Perform a physical exam.  Have you provide a sample of your urine for lab testing.  Use a cotton swab to gently collect a sample from your urethra for lab testing.  How is this treated? It is important to treat urethritis. Depending on the cause, untreated urethritis may lead to serious genital infections and possibly infertility. Urethritis caused by a bacterial infection is treated with antibiotic medicine. All sexual partners must be treated. Follow these instructions at home:  Do not have sex until the test results are known and treatment is completed, even if your symptoms go away before you finish treatment.  If you were prescribed an antibiotic, finish it all even if you start to feel better. Contact a health care provider if:  Your symptoms are not improved in 3 days.  Your symptoms are getting worse.  You develop abdominal pain or pelvic pain (in women).  You develop joint pain.  You have a fever. Get help right away if:  You have severe pain in the belly, back, or side.  You have repeated vomiting. This information is not  intended to replace advice given to you by your health care provider. Make sure you discuss any questions you have with your health care provider. Document Released: 10/21/2000 Document Revised: 10/03/2015 Document Reviewed: 12/26/2012 Elsevier Interactive Patient Education  2017 Elsevier Inc.  

## 2016-04-06 ENCOUNTER — Telehealth: Payer: Self-pay | Admitting: *Deleted

## 2016-04-06 DIAGNOSIS — N3000 Acute cystitis without hematuria: Secondary | ICD-10-CM

## 2016-04-06 NOTE — Telephone Encounter (Signed)
Pt informed, order placed.  

## 2016-04-06 NOTE — Telephone Encounter (Signed)
I would recommend dropping off a urine analysis to make sure what we are treating

## 2016-04-06 NOTE — Telephone Encounter (Signed)
(  You are back up MD) pt was seen on 03/31/16 was prescribed Septra DS due to perineal discomfort, irritation, pt said that is somewhat better, pt did not take medication nancy told her to hold on to Rx if symptoms became worse over the holiday, has history of C-Diff in past she took antibiotic and was very sick with C-Diff. Pt said since Saturday she has had frequent urination. States her u/a has been fine, asked if you think it would be wise to take Rx?

## 2016-04-07 ENCOUNTER — Other Ambulatory Visit: Payer: 59

## 2016-04-07 DIAGNOSIS — D225 Melanocytic nevi of trunk: Secondary | ICD-10-CM | POA: Diagnosis not present

## 2016-04-07 DIAGNOSIS — L818 Other specified disorders of pigmentation: Secondary | ICD-10-CM | POA: Diagnosis not present

## 2016-04-07 DIAGNOSIS — N3000 Acute cystitis without hematuria: Secondary | ICD-10-CM

## 2016-04-07 DIAGNOSIS — Z23 Encounter for immunization: Secondary | ICD-10-CM | POA: Diagnosis not present

## 2016-04-07 DIAGNOSIS — L821 Other seborrheic keratosis: Secondary | ICD-10-CM | POA: Diagnosis not present

## 2016-04-07 DIAGNOSIS — D2272 Melanocytic nevi of left lower limb, including hip: Secondary | ICD-10-CM | POA: Diagnosis not present

## 2016-04-07 DIAGNOSIS — Z86018 Personal history of other benign neoplasm: Secondary | ICD-10-CM | POA: Diagnosis not present

## 2016-04-08 LAB — URINALYSIS W MICROSCOPIC + REFLEX CULTURE
BACTERIA UA: NONE SEEN [HPF]
Bilirubin Urine: NEGATIVE
CASTS: NONE SEEN [LPF]
CRYSTALS: NONE SEEN [HPF]
GLUCOSE, UA: NEGATIVE
HGB URINE DIPSTICK: NEGATIVE
Ketones, ur: NEGATIVE
LEUKOCYTES UA: NEGATIVE
Nitrite: NEGATIVE
PROTEIN: NEGATIVE
RBC / HPF: NONE SEEN RBC/HPF (ref <=2)
SQUAMOUS EPITHELIAL / LPF: NONE SEEN [HPF] (ref <=5)
Specific Gravity, Urine: 1.021 (ref 1.001–1.035)
WBC, UA: NONE SEEN WBC/HPF (ref <=5)
YEAST: NONE SEEN [HPF]
pH: 5.5 (ref 5.0–8.0)

## 2016-04-10 ENCOUNTER — Telehealth: Payer: Self-pay | Admitting: *Deleted

## 2016-04-10 MED FILL — PROAIR HFA 90 MCG INHALER: 108 (90 BAS | 75 days supply | Qty: 26 | Fill #0

## 2016-04-10 MED FILL — METOPROLOL SUCC ER 50 MG TA: 50 | 90 days supply | Qty: 90 | Fill #0

## 2016-04-10 NOTE — Telephone Encounter (Signed)
Pt informed with negative U/A results on 04/07/16

## 2016-04-28 DIAGNOSIS — E7800 Pure hypercholesterolemia, unspecified: Secondary | ICD-10-CM | POA: Diagnosis not present

## 2016-05-05 MED FILL — ASMANEX TWISTHALER 220 MCG: 220 | 30 days supply | Qty: 1 | Fill #0

## 2016-06-01 MED FILL — MONTELUKAST SOD 10 MG TAB: 10 | 90 days supply | Qty: 90 | Fill #1

## 2016-06-29 MED FILL — CHLORTHALIDONE 25 MG TABLET: 25 | 30 days supply | Qty: 30 | Fill #0

## 2016-07-03 DIAGNOSIS — H524 Presbyopia: Secondary | ICD-10-CM | POA: Diagnosis not present

## 2016-07-13 DIAGNOSIS — I119 Hypertensive heart disease without heart failure: Secondary | ICD-10-CM | POA: Diagnosis not present

## 2016-07-21 MED FILL — METOPROLOL SUCC ER 50 MG TA: 50 | 90 days supply | Qty: 90 | Fill #1

## 2016-08-03 DIAGNOSIS — R194 Change in bowel habit: Secondary | ICD-10-CM | POA: Diagnosis not present

## 2016-08-13 DIAGNOSIS — A09 Infectious gastroenteritis and colitis, unspecified: Secondary | ICD-10-CM | POA: Diagnosis not present

## 2016-09-23 ENCOUNTER — Encounter: Payer: Self-pay | Admitting: Gynecology

## 2016-10-09 MED FILL — ASMANEX TWISTHALER 220 MCG: 220 | 30 days supply | Qty: 1 | Fill #1

## 2016-10-19 DIAGNOSIS — R002 Palpitations: Secondary | ICD-10-CM | POA: Diagnosis not present

## 2016-10-19 DIAGNOSIS — I119 Hypertensive heart disease without heart failure: Secondary | ICD-10-CM | POA: Diagnosis not present

## 2016-10-19 DIAGNOSIS — Z0000 Encounter for general adult medical examination without abnormal findings: Secondary | ICD-10-CM | POA: Diagnosis not present

## 2016-10-19 DIAGNOSIS — M79662 Pain in left lower leg: Secondary | ICD-10-CM | POA: Diagnosis not present

## 2016-10-21 MED FILL — CHLORTHALIDONE 25 MG TABLET: 25 | 30 days supply | Qty: 30 | Fill #1

## 2016-10-21 MED FILL — METOPROLOL SUCC ER 50 MG TA: 50 | 90 days supply | Qty: 90 | Fill #2

## 2016-11-16 MED FILL — MONTELUKAST SOD 10 MG TAB: 10 | 30 days supply | Qty: 30 | Fill #0

## 2016-11-17 MED FILL — ASMANEX TWISTHALER 220 MCG: 220 | 30 days supply | Qty: 1 | Fill #2

## 2016-12-17 DIAGNOSIS — R0789 Other chest pain: Secondary | ICD-10-CM | POA: Diagnosis not present

## 2016-12-17 DIAGNOSIS — R002 Palpitations: Secondary | ICD-10-CM | POA: Diagnosis not present

## 2016-12-25 ENCOUNTER — Telehealth: Payer: Self-pay | Admitting: Cardiology

## 2016-12-25 NOTE — Telephone Encounter (Signed)
Received incoming records from Jilda Panda, MD for upcoming appointment on 02/10/17 @ 10:00am with Dr. Martin[place]. Records given to [facility] in Medical Records. 12/25/16/ab

## 2017-01-15 MED FILL — METOPROLOL SUCC ER 50 MG TA: 50 | 90 days supply | Qty: 90 | Fill #3

## 2017-01-15 MED FILL — CHLORTHALIDONE 25 MG TABLET: 25 | 90 days supply | Qty: 90 | Fill #2

## 2017-01-18 MED FILL — ASMANEX TWISTHALER 220 MCG: 220 | 30 days supply | Qty: 1 | Fill #3

## 2017-02-05 NOTE — Progress Notes (Signed)
Cardiology Office Note   Date:  02/10/2017   ID:  Faith Miller, DOB Feb 14, 1966, MRN [id number]  PCP:  Jilda Panda, MD  Cardiologist:   Peter Martinique, MD   Chief Complaint  Patient presents with  . Palpitations    pt c/o palpitations, also some dizziness and weakness at time      History of Present Illness: Faith Miller is a 51 y.o. female who is seen at the request of Dr. Mellody Drown for evaluation of chest pain and palpitations. She has a history of asthma, HTN and HLD. She was last seen by me in January 2014 for evaluation of palpitations. Event monitor was normal. Echo done in 2013 showed mild mitral regurgitation, otherwise normal.  She reports that about 2 months ago she experienced dizziness and weakness along with her palpitations. This was new. She still has sporadic palpitations- like a "flip-flop". Notices this mostly when lying down. She states she had been started on chlorthalidone for BP and when she reduced the dose to 1/2 tablet daily the weakness resolved. She does note some sporadic soreness in her rib cage. Location varies. Sometimes sore to touch. Not related to activity.    Past Medical History:  Diagnosis Date  . Asthma   . History of elevated lipids   . Hypertension   . IC (interstitial cystitis)     Past Surgical History:  Procedure Laterality Date  . GALLBLADDER SURGERY    . WISDOM TOOTH EXTRACTION       Current Outpatient Prescriptions  Medication Sig Dispense Refill  . albuterol (PROVENTIL HFA;VENTOLIN HFA) 108 (90 Base) MCG/ACT inhaler Inhale 2 puffs into the lungs every 6 (six) hours as needed for wheezing or shortness of breath.    . chlorthalidone (HYGROTON) 25 MG tablet Take 12.5 mg by mouth daily.  6  . ibuprofen (ADVIL,MOTRIN) 200 MG tablet Take 200 mg by mouth every 6 (six) hours as needed for moderate pain.    . mometasone (ASMANEX) 220 MCG/INH inhaler Inhale 2 puffs into the lungs daily as needed (shortness of breath).     .  montelukast (SINGULAIR) 10 MG tablet Take 10 mg by mouth at bedtime.    . nebivolol (BYSTOLIC) 5 MG tablet Take 1 tablet (5 mg total) by mouth daily. 30 tablet 11   No current facility-administered medications for this visit.     Allergies:   Aspirin    Social History:  The patient  reports that she has never smoked. She has never used smokeless tobacco. She reports that she drinks alcohol. She reports that she does not use drugs.   Family History:  The patient's family history includes Breast cancer in her maternal grandmother; Cancer in her paternal grandmother; Heart disease in her father and mother; Hypertension in her father and mother.    ROS:  Please see the history of present illness.   Otherwise, review of systems are positive for none.   All other systems are reviewed and negative.    PHYSICAL EXAM: VS:  BP (!) 144/86   Pulse 69   Ht 5\' 2"  (1.575 m)   Wt 137 lb 9.6 oz (62.4 kg)   BMI 25.17 kg/m  , BMI Body mass index is 25.17 kg/m. GEN: Well nourished, well developed, in no acute distress  HEENT: normal  Neck: no JVD, carotid bruits, or masses Cardiac: RRR; no murmurs, rubs, or gallops,no edema  Respiratory:  clear to auscultation bilaterally, normal work of breathing GI: soft, nontender, nondistended, + BS  MS: no deformity or atrophy  Skin: warm and dry, no rash Neuro:  Strength and sensation are intact Psych: euthymic mood, full affect   EKG:  EKG is ordered today. The ekg ordered today demonstrates NSR rate 69. Normal Ecg. I have personally reviewed and interpreted this study.    Recent Labs: No results found for requested labs within last 8760 hours.    Lipid Panel No results found for: CHOL, TRIG, HDL, CHOLHDL, VLDL, LDLCALC, LDLDIRECT    Wt Readings from Last 3 Encounters:  02/10/17 137 lb 9.6 oz (62.4 kg)  03/31/16 135 lb (61.2 kg)  03/10/16 135 lb (61.2 kg)      Other studies Reviewed: Additional studies/ records that were reviewed today  include: none   ASSESSMENT AND PLAN:  1.  Palpitations. Chronic and unchanged. Prior evaluation in 2014 unremarkable. Continue beta blocker therapy.  2. Weakness and dizziness - appears to be related to chlorthalidone. It appears to me we may be able to simplify her antihypertensive therapy. I have recommended switching Toprol XL to Bystolic 5 mg daily. I think this will be more efficacious and perhaps we could get her off the chlorthalidone. Will follow up in 4-6 weeks.   3. HTN. See #2.  4. Atypical chest pain. History more c/w musculoskeletal pain.   Current medicines are reviewed at length with the patient today.  The patient does not have concerns regarding medicines.  The following changes have been made:  See above  Labs/ tests ordered today include: none  Orders Placed This Encounter  Procedures  . EKG 12-Lead    Signed, Peter Martinique, MD  02/10/2017 11:56 AM    [facility] [address], [place], [place], [address] Phone [phone], Fax [phone]

## 2017-02-10 ENCOUNTER — Ambulatory Visit (INDEPENDENT_AMBULATORY_CARE_PROVIDER_SITE_OTHER): Payer: 59 | Admitting: Cardiology

## 2017-02-10 ENCOUNTER — Encounter: Payer: Self-pay | Admitting: Cardiology

## 2017-02-10 VITALS — BP 144/86 | HR 69 | Ht 62.0 in | Wt 137.6 lb

## 2017-02-10 DIAGNOSIS — I10 Essential (primary) hypertension: Secondary | ICD-10-CM | POA: Diagnosis not present

## 2017-02-10 DIAGNOSIS — R0789 Other chest pain: Secondary | ICD-10-CM

## 2017-02-10 DIAGNOSIS — R002 Palpitations: Secondary | ICD-10-CM

## 2017-02-10 MED ORDER — NEBIVOLOL HCL 5 MG PO TABS
5.0000 mg | ORAL_TABLET | Freq: Every day | ORAL | 11 refills | Status: DC
Start: 2017-02-10 — End: 2018-03-03

## 2017-02-10 MED FILL — MONTELUKAST SOD 10 MG TAB: 10 | 30 days supply | Qty: 30 | Fill #1

## 2017-02-10 NOTE — Patient Instructions (Signed)
We will switch Toprol XL to Bystolic 5 mg daily  Monitor your Blood pressure  We will followup in a month.

## 2017-03-02 ENCOUNTER — Encounter: Payer: Self-pay | Admitting: Cardiology

## 2017-03-10 ENCOUNTER — Telehealth: Payer: Self-pay | Admitting: Cardiology

## 2017-03-10 MED FILL — BYSTOLIC 5 MG TABLET: 5 | 90 days supply | Qty: 90 | Fill #0

## 2017-03-10 NOTE — Telephone Encounter (Signed)
New Message     Is pt suppose to be taking both the Bystolic and the metoprolol

## 2017-03-10 NOTE — Telephone Encounter (Signed)
Per note from last OV: I have recommended switching Toprol XL to Bystolic 5 mg daily. I think this will be more efficacious and perhaps we could get her off the chlorthalidone. Will follow up in 4-6 weeks.   Pharmacist notified

## 2017-03-17 NOTE — Progress Notes (Signed)
Cardiology Office Note   Date:  03/18/2017   ID:  Faith Miller, DOB 30-Jul-1965, MRN [id number]  PCP:  Jilda Panda, MD  Cardiologist:   Peter Martinique, MD   Chief Complaint  Patient presents with  . Follow-up    1 months;      History of Present Illness: Faith Miller is a 51 y.o. female who is seen for follow up  of HTN and palpitations. She has a history of asthma, HTN and HLD. She was last seen by me in January 2014 for evaluation of palpitations. Event monitor was normal. Echo done in 2013 showed mild mitral regurgitation, otherwise normal.  She presented last month with symptoms of  dizziness and weakness along with her palpitations. This was new. She still has sporadic palpitations- like a "flip-flop". Notices this mostly when lying down. She states she had been started on chlorthalidone for BP and when she reduced the dose to 1/2 tablet daily the weakness resolved. I felt her chest pain was musculoskeletal. Her chlorthalidone was discontinued and we started her on Bystolic. She has done well on this. BP ranging from 120-130/70-80. No more flip-flopping sensation but does have very brief flutters. Feels better on this medication with improved energy and no cold sensitivity.     Past Medical History:  Diagnosis Date  . Asthma   . History of elevated lipids   . Hypertension   . IC (interstitial cystitis)     Past Surgical History:  Procedure Laterality Date  . GALLBLADDER SURGERY    . WISDOM TOOTH EXTRACTION       Current Outpatient Medications  Medication Sig Dispense Refill  . albuterol (PROVENTIL HFA;VENTOLIN HFA) 108 (90 Base) MCG/ACT inhaler Inhale 2 puffs into the lungs every 6 (six) hours as needed for wheezing or shortness of breath.    Marland Kitchen ibuprofen (ADVIL,MOTRIN) 200 MG tablet Take 200 mg by mouth every 6 (six) hours as needed for moderate pain.    . mometasone (ASMANEX) 220 MCG/INH inhaler Inhale 2 puffs into the lungs daily as needed (shortness of  breath).     . montelukast (SINGULAIR) 10 MG tablet Take 10 mg by mouth at bedtime.    . nebivolol (BYSTOLIC) 5 MG tablet Take 1 tablet (5 mg total) by mouth daily. 30 tablet 11   No current facility-administered medications for this visit.     Allergies:   Aspirin    Social History:  The patient  reports that  has never smoked. she has never used smokeless tobacco. She reports that she drinks alcohol. She reports that she does not use drugs.   Family History:  The patient's family history includes Breast cancer in her maternal grandmother; Cancer in her paternal grandmother; Heart disease in her father and mother; Hypertension in her father and mother.    ROS:  Please see the history of present illness.   Otherwise, review of systems are positive for none.   All other systems are reviewed and negative.    PHYSICAL EXAM: VS:  BP 139/83   Pulse 86   Ht 5\' 2"  (1.575 m)   Wt 137 lb 9.6 oz (62.4 kg)   BMI 25.17 kg/m  , BMI Body mass index is 25.17 kg/m. GENERAL:  Well appearing HEENT:  PERRL, EOMI, sclera are clear. Oropharynx is clear. NECK:  No jugular venous distention, carotid upstroke brisk and symmetric, no bruits, no thyromegaly or adenopathy LUNGS:  Clear to auscultation bilaterally CHEST:  Unremarkable HEART:  RRR,  PMI  not displaced or sustained,S1 and S2 within normal limits, no S3, no S4: no clicks, no rubs, no murmurs ABD:  Soft, nontender. BS +, no masses or bruits. No hepatomegaly, no splenomegaly EXT:  2 + pulses throughout, no edema, no cyanosis no clubbing SKIN:  Warm and dry.  No rashes NEURO:  Alert and oriented x 3. Cranial nerves II through XII intact. PSYCH:  Cognitively intact     EKG:  EKG is not ordered today.     Recent Labs: No results found for requested labs within last 8760 hours.    Lipid Panel No results found for: CHOL, TRIG, HDL, CHOLHDL, VLDL, LDLCALC, LDLDIRECT    Wt Readings from Last 3 Encounters:  03/18/17 137 lb 9.6 oz (62.4  kg)  02/10/17 137 lb 9.6 oz (62.4 kg)  03/31/16 135 lb (61.2 kg)      Other studies Reviewed: Additional studies/ records that were reviewed today include: none   ASSESSMENT AND PLAN:  1.  Palpitations. Chronic and improved on Bystolic. Prior evaluation in 2014 unremarkable. Continue beta blocker therapy.  2. Weakness and dizziness - resolved. Appears to be tolerating Bystolic well. Will continue current therapy. If cost is prohibitive I would consider switching to an ARB.  3. HTN. See #2.    Current medicines are reviewed at length with the patient today.  The patient does not have concerns regarding medicines.  The following changes have been made:  See above  Labs/ tests ordered today include: none  No orders of the defined types were placed in this encounter.   Signed, Peter Martinique, MD  03/18/2017 8:13 AM    [facility] [address], [place], [place], [address] Phone [phone], Fax [phone]

## 2017-03-18 ENCOUNTER — Ambulatory Visit (INDEPENDENT_AMBULATORY_CARE_PROVIDER_SITE_OTHER): Payer: 59 | Admitting: Cardiology

## 2017-03-18 ENCOUNTER — Encounter: Payer: Self-pay | Admitting: Cardiology

## 2017-03-18 VITALS — BP 139/83 | HR 86 | Ht 62.0 in | Wt 137.6 lb

## 2017-03-18 DIAGNOSIS — I10 Essential (primary) hypertension: Secondary | ICD-10-CM

## 2017-03-18 DIAGNOSIS — R002 Palpitations: Secondary | ICD-10-CM | POA: Diagnosis not present

## 2017-03-18 NOTE — Patient Instructions (Signed)
Continue your current therapy  I will see you in 6 months.   

## 2017-03-29 ENCOUNTER — Ambulatory Visit (INDEPENDENT_AMBULATORY_CARE_PROVIDER_SITE_OTHER): Payer: 59 | Admitting: Women's Health

## 2017-03-29 ENCOUNTER — Encounter: Payer: Self-pay | Admitting: Women's Health

## 2017-03-29 VITALS — BP 124/82 | Ht 62.0 in | Wt 138.0 lb

## 2017-03-29 DIAGNOSIS — Z01419 Encounter for gynecological examination (general) (routine) without abnormal findings: Secondary | ICD-10-CM | POA: Diagnosis not present

## 2017-03-29 MED ORDER — ESTRADIOL 10 MCG VA TABS: ORAL_TABLET | VAGINAL | 11 refills | Status: DC

## 2017-03-29 NOTE — Progress Notes (Signed)
Faith Miller 10-Apr-1966 [id number]    History:    Presents for annual exam.   Monthly Cycles every 25-28 days, rhythm method. History of hypertension and anxiety/depression primary care managing. History of C. Difficile. Normal Pap and mammogram history.  Past medical history, past surgical history, family history and social history were all reviewed and documented in the EPIC chart.  Works for [employer]. Parents hypertension. 3 daughters all doing well.  ROS:  A ROS was performed and pertinent positives and negatives are included.  Exam:  Vitals:   03/29/17 1405  Weight: 138 lb (62.6 kg)  Height: 5\' 2"  (1.575 m)   Body mass index is 25.24 kg/m.   General appearance:  Normal Thyroid:  Symmetrical, normal in size, without palpable masses or nodularity. Respiratory  Auscultation:  Clear without wheezing or rhonchi Cardiovascular  Auscultation:  Regular rate, without rubs, murmurs or gallops  Edema/varicosities:  Not grossly evident Abdominal  Soft,nontender, without masses, guarding or rebound.  Liver/spleen:  No organomegaly noted  Hernia:  None appreciated  Skin  Inspection:  Grossly normal   Breasts: Examined lying and sitting.     Right: Without masses, retractions, discharge or axillary adenopathy.     Left: Without masses, retractions, discharge or axillary adenopathy. Gentitourinary   Inguinal/mons:  Normal without inguinal adenopathy  External genitalia:  Normal  BUS/Urethra/Skene's glands:  Normal  Vagina:  Normal  Cervix:  Normal  Uterus:   normal in size, shape and contour.  Midline and mobile  Adnexa/parametria:     Rt: Without masses or tenderness.   Lt: Without masses or tenderness.  Anus and perineum: Normal  Digital rectal exam: Normal sphincter tone without palpated masses or tenderness  Assessment/Plan:  51 y.o. M WFG3 P3  for annual exam with complaint of dyspareunia/vaginal dryness,  no relief with over-the-counter vaginal lubricants .  Monthly  cycle/rhythm method Hypertension, asthma - primary care manages labs and meds  Plan:   Options reviewed. Will try Vagifem one applicator at bedtime for 2 weeks and then twice weekly thereafter . Continue over-the-counter lubricants. Instructed to call if no relief. SBE's, continue screening mammogram due this month instructed to schedule. Encouraged regular cardio type exercise, low-salt diet, calcium rich diet, over-the-counter vitamin D 1000 daily encouraged. Pap normal 2016, new screening guidelines reviewed.    [name] [facility], 2:05 PM 03/29/2017

## 2017-03-29 NOTE — Patient Instructions (Addendum)
Estradiol vaginal tablets What is this medicine? ESTRADIOL (es tra DYE ole) vaginal tablet is used to help relieve symptoms of vaginal irritation and dryness that occurs in some women during menopause. This medicine may be used for other purposes; ask your health care provider or pharmacist if you have questions. COMMON BRAND NAME(S): Vagifem, Yuvafem What should I tell my health care provider before I take this medicine? They need to know if you have any of these conditions: -abnormal vaginal bleeding -blood vessel disease or blood clots -breast, cervical, endometrial, ovarian, liver, or uterine cancer -dementia -diabetes -gallbladder disease -heart disease or recent heart attack -high blood pressure -high cholesterol -high level of calcium in the blood -hysterectomy -kidney disease -liver disease -migraine headaches -protein C deficiency -protein S deficiency -stroke -systemic lupus erythematosus (SLE) -tobacco smoker -an unusual or allergic reaction to estrogens, other hormones, medicines, foods, dyes, or preservatives -pregnant or trying to get pregnant -breast-feeding How should I use this medicine? This medicine is only for use in the vagina. Do not take by mouth. Wash and dry your hands before and after use. Read package directions carefully. Unwrap the applicator package. Be sure to use a new applicator for each dose. Use at the same time each day. If the tablet has fallen out of the applicator, but is still in the package, carefully place it back into the applicator. If the tablet has fallen out of the package, that applicator should be thrown out and you should use a new applicator containing a new tablet. Lie on your back, part and bend your knees. Gently insert the applicator as far as comfortably possible into the vagina. Then, gently press the plunger until the plunger is fully depressed. This will release the tablet into the vagina. Gently remove the applicator. Throw  away the applicator after use. Do not use your medicine more often than directed. Do not stop using except on the advice of your doctor or health care professional. Talk to your pediatrician regarding the use of this medicine in children. This medicine is not approved for use in children. A patient package insert for the product will be given with each prescription and refill. Read this sheet carefully each time. The sheet may change frequently. Overdosage: If you think you have taken too much of this medicine contact a poison control center or emergency room at once. NOTE: This medicine is only for you. Do not share this medicine with others. What if I miss a dose? If you miss a dose, take it as soon as you can. If it is almost time for your next dose, take only that dose. Do not take double or extra doses. What may interact with this medicine? Do not take this medicine with any of the following medications: -aromatase inhibitors like aminoglutethimide, anastrozole, exemestane, letrozole, testolactone This medicine may also interact with the following medications: -antibiotics used to treat tuberculosis like rifabutin, rifampin and rifapentene -raloxifene or tamoxifen -warfarin This list may not describe all possible interactions. Give your health care provider a list of all the medicines, herbs, non-prescription drugs, or dietary supplements you use. Also tell them if you smoke, drink alcohol, or use illegal drugs. Some items may interact with your medicine. What should I watch for while using this medicine? Visit your health care professional for regular checks on your progress. You will need a regular breast and pelvic exam. You should also discuss the need for regular mammograms with your health care professional, and follow his or her  guidelines. This medicine can make your body retain fluid, making your fingers, hands, or ankles swell. Your blood pressure can go up. Contact your doctor or  health care professional if you feel you are retaining fluid. If you have any reason to think you are pregnant; stop taking this medicine at once and contact your doctor or health care professional. Tobacco smoking increases the risk of getting a blood clot or having a stroke, especially if you are more than 49 years old. You are strongly advised not to smoke. If you wear contact lenses and notice visual changes, or if the lenses begin to feel uncomfortable, consult your eye care specialist. If you are going to have elective surgery, you may need to stop taking this medicine beforehand. Consult your health care professional for advice prior to scheduling the surgery. What side effects may I notice from receiving this medicine? Side effects that you should report to your doctor or health care professional as soon as possible: -allergic reactions like skin rash, itching or hives, swelling of the face, lips, or tongue -breast tissue changes or discharge -changes in vision -chest pain -confusion, trouble speaking or understanding -dark urine -general ill feeling or flu-like symptoms -light-colored stools -nausea, vomiting -pain, swelling, warmth in the leg -right upper belly pain -severe headaches -shortness of breath -sudden numbness or weakness of the face, arm or leg -trouble walking, dizziness, loss of balance or coordination -unusual vaginal bleeding -yellowing of the eyes or skin Side effects that usually do not require medical attention (report to your doctor or health care professional if they continue or are bothersome): -hair loss -increased hunger or thirst -increased urination -symptoms of vaginal infection like itching, irritation or unusual discharge -unusually weak or tired This list may not describe all possible side effects. Call your doctor for medical advice about side effects. You may report side effects to FDA at 1-800-FDA-1088. Where should I keep my medicine? Keep  out of the reach of children. Store at room temperature between 15 and 30 degrees C (59 and 86 degrees F). Throw away any unused medicine after the expiration date. NOTE: This sheet is a summary. It may not cover all possible information. If you have questions about this medicine, talk to your doctor, pharmacist, or health care provider.  2018 Elsevier/Gold Standard (2014-04-11 09:22:51) Health Maintenance, Female Adopting a healthy lifestyle and getting preventive care can go a long way to promote health and wellness. Talk with your health care provider about what schedule of regular examinations is right for you. This is a good chance for you to check in with your provider about disease prevention and staying healthy. In between checkups, there are plenty of things you can do on your own. Experts have done a lot of research about which lifestyle changes and preventive measures are most likely to keep you healthy. Ask your health care provider for more information. Weight and diet Eat a healthy diet  Be sure to include plenty of vegetables, fruits, low-fat dairy products, and lean protein.  Do not eat a lot of foods high in solid fats, added sugars, or salt.  Get regular exercise. This is one of the most important things you can do for your health. ? Most adults should exercise for at least 150 minutes each week. The exercise should increase your heart rate and make you sweat (moderate-intensity exercise). ? Most adults should also do strengthening exercises at least twice a week. This is in addition to the moderate-intensity exercise.  Maintain a healthy weight  Body mass index (BMI) is a measurement that can be used to identify possible weight problems. It estimates body fat based on height and weight. Your health care provider can help determine your BMI and help you achieve or maintain a healthy weight.  For females 46 years of age and older: ? A BMI below 18.5 is considered  underweight. ? A BMI of 18.5 to 24.9 is normal. ? A BMI of 25 to 29.9 is considered overweight. ? A BMI of 30 and above is considered obese.  Watch levels of cholesterol and blood lipids  You should start having your blood tested for lipids and cholesterol at 49 years of age, then have this test every 5 years.  You may need to have your cholesterol levels checked more often if: ? Your lipid or cholesterol levels are high. ? You are older than 49 years of age. ? You are at high risk for heart disease.  Cancer screening Lung Cancer  Lung cancer screening is recommended for adults 81-63 years old who are at high risk for lung cancer because of a history of smoking.  A yearly low-dose CT scan of the lungs is recommended for people who: ? Currently smoke. ? Have quit within the past 15 years. ? Have at least a 30-pack-year history of smoking. A pack year is smoking an average of one pack of cigarettes a day for 1 year.  Yearly screening should continue until it has been 15 years since you quit.  Yearly screening should stop if you develop a health problem that would prevent you from having lung cancer treatment.  Breast Cancer  Practice breast self-awareness. This means understanding how your breasts normally appear and feel.  It also means doing regular breast self-exams. Let your health care provider know about any changes, no matter how small.  If you are in your 20s or 30s, you should have a clinical breast exam (CBE) by a health care provider every 1-3 years as part of a regular health exam.  If you are 68 or older, have a CBE every year. Also consider having a breast X-ray (mammogram) every year.  If you have a family history of breast cancer, talk to your health care provider about genetic screening.  If you are at high risk for breast cancer, talk to your health care provider about having an MRI and a mammogram every year.  Breast cancer gene (BRCA) assessment is  recommended for women who have family members with BRCA-related cancers. BRCA-related cancers include: ? Breast. ? Ovarian. ? Tubal. ? Peritoneal cancers.  Results of the assessment will determine the need for genetic counseling and BRCA1 and BRCA2 testing.  Cervical Cancer Your health care provider may recommend that you be screened regularly for cancer of the pelvic organs (ovaries, uterus, and vagina). This screening involves a pelvic examination, including checking for microscopic changes to the surface of your cervix (Pap test). You may be encouraged to have this screening done every 3 years, beginning at age 33.  For women ages 61-65, health care providers may recommend pelvic exams and Pap testing every 3 years, or they may recommend the Pap and pelvic exam, combined with testing for human papilloma virus (HPV), every 5 years. Some types of HPV increase your risk of cervical cancer. Testing for HPV may also be done on women of any age with unclear Pap test results.  Other health care providers may not recommend any screening for nonpregnant women  who are considered low risk for pelvic cancer and who do not have symptoms. Ask your health care provider if a screening pelvic exam is right for you.  If you have had past treatment for cervical cancer or a condition that could lead to cancer, you need Pap tests and screening for cancer for at least 20 years after your treatment. If Pap tests have been discontinued, your risk factors (such as having a new sexual partner) need to be reassessed to determine if screening should resume. Some women have medical problems that increase the chance of getting cervical cancer. In these cases, your health care provider may recommend more frequent screening and Pap tests.  Colorectal Cancer  This type of cancer can be detected and often prevented.  Routine colorectal cancer screening usually begins at 49 years of age and continues through 49 years of  age.  Your health care provider may recommend screening at an earlier age if you have risk factors for colon cancer.  Your health care provider may also recommend using home test kits to check for hidden blood in the stool.  A small camera at the end of a tube can be used to examine your colon directly (sigmoidoscopy or colonoscopy). This is done to check for the earliest forms of colorectal cancer.  Routine screening usually begins at age 25.  Direct examination of the colon should be repeated every 5-10 years through 49 years of age. However, you may need to be screened more often if early forms of precancerous polyps or small growths are found.  Skin Cancer  Check your skin from head to toe regularly.  Tell your health care provider about any new moles or changes in moles, especially if there is a change in a mole's shape or color.  Also tell your health care provider if you have a mole that is larger than the size of a pencil eraser.  Always use sunscreen. Apply sunscreen liberally and repeatedly throughout the day.  Protect yourself by wearing long sleeves, pants, a wide-brimmed hat, and sunglasses whenever you are outside.  Heart disease, diabetes, and high blood pressure  High blood pressure causes heart disease and increases the risk of stroke. High blood pressure is more likely to develop in: ? People who have blood pressure in the high end of the normal range (130-139/85-89 mm Hg). ? People who are overweight or obese. ? People who are African American.  If you are 8-73 years of age, have your blood pressure checked every 3-5 years. If you are 59 years of age or older, have your blood pressure checked every year. You should have your blood pressure measured twice-once when you are at a hospital or clinic, and once when you are not at a hospital or clinic. Record the average of the two measurements. To check your blood pressure when you are not at a hospital or clinic, you  can use: ? An automated blood pressure machine at a pharmacy. ? A home blood pressure monitor.  If you are between 64 years and 68 years old, ask your health care provider if you should take aspirin to prevent strokes.  Have regular diabetes screenings. This involves taking a blood sample to check your fasting blood sugar level. ? If you are at a normal weight and have a low risk for diabetes, have this test once every three years after 49 years of age. ? If you are overweight and have a high risk for diabetes, consider being tested  at a younger age or more often. Preventing infection Hepatitis B  If you have a higher risk for hepatitis B, you should be screened for this virus. You are considered at high risk for hepatitis B if: ? You were born in a country where hepatitis B is common. Ask your health care provider which countries are considered high risk. ? Your parents were born in a high-risk country, and you have not been immunized against hepatitis B (hepatitis B vaccine). ? You have HIV or AIDS. ? You use needles to inject street drugs. ? You live with someone who has hepatitis B. ? You have had sex with someone who has hepatitis B. ? You get hemodialysis treatment. ? You take certain medicines for conditions, including cancer, organ transplantation, and autoimmune conditions.  Hepatitis C  Blood testing is recommended for: ? Everyone born from [birth year] through 1963. ? Anyone with known risk factors for hepatitis C.  Sexually transmitted infections (STIs)  You should be screened for sexually transmitted infections (STIs) including gonorrhea and chlamydia if: ? You are sexually active and are younger than 49 years of age. ? You are older than 49 years of age and your health care provider tells you that you are at risk for this type of infection. ? Your sexual activity has changed since you were last screened and you are at an increased risk for chlamydia or gonorrhea. Ask your  health care provider if you are at risk.  If you do not have HIV, but are at risk, it may be recommended that you take a prescription medicine daily to prevent HIV infection. This is called pre-exposure prophylaxis (PrEP). You are considered at risk if: ? You are sexually active and do not regularly use condoms or know the HIV status of your partner(s). ? You take drugs by injection. ? You are sexually active with a partner who has HIV.  Talk with your health care provider about whether you are at high risk of being infected with HIV. If you choose to begin PrEP, you should first be tested for HIV. You should then be tested every 3 months for as long as you are taking PrEP. Pregnancy  If you are premenopausal and you may become pregnant, ask your health care provider about preconception counseling.  If you may become pregnant, take 400 to 800 micrograms (mcg) of folic acid every day.  If you want to prevent pregnancy, talk to your health care provider about birth control (contraception). Osteoporosis and menopause  Osteoporosis is a disease in which the bones lose minerals and strength with aging. This can result in serious bone fractures. Your risk for osteoporosis can be identified using a bone density scan.  If you are 51 years of age or older, or if you are at risk for osteoporosis and fractures, ask your health care provider if you should be screened.  Ask your health care provider whether you should take a calcium or vitamin D supplement to lower your risk for osteoporosis.  Menopause may have certain physical symptoms and risks.  Hormone replacement therapy may reduce some of these symptoms and risks. Talk to your health care provider about whether hormone replacement therapy is right for you. Follow these instructions at home:  Schedule regular health, dental, and eye exams.  Stay current with your immunizations.  Do not use any tobacco products including cigarettes, chewing  tobacco, or electronic cigarettes.  If you are pregnant, do not drink alcohol.  If you are breastfeeding,  limit how much and how often you drink alcohol.  Limit alcohol intake to no more than 1 drink per day for nonpregnant women. One drink equals 12 ounces of beer, 5 ounces of wine, or 1 ounces of hard liquor.  Do not use street drugs.  Do not share needles.  Ask your health care provider for help if you need support or information about quitting drugs.  Tell your health care provider if you often feel depressed.  Tell your health care provider if you have ever been abused or do not feel safe at home. This information is not intended to replace advice given to you by your health care provider. Make sure you discuss any questions you have with your health care provider. Document Released: 11/10/2010 Document Revised: 10/03/2015 Document Reviewed: 01/29/2015 Elsevier Interactive Patient Education  [employer].

## 2017-03-30 LAB — URINALYSIS W MICROSCOPIC + REFLEX CULTURE
BILIRUBIN URINE: NEGATIVE
Bacteria, UA: NONE SEEN /HPF
Glucose, UA: NEGATIVE
HGB URINE DIPSTICK: NEGATIVE
Hyaline Cast: NONE SEEN /LPF
KETONES UR: NEGATIVE
LEUKOCYTE ESTERASE: NEGATIVE
NITRITES URINE, INITIAL: NEGATIVE
PROTEIN: NEGATIVE
RBC / HPF: NONE SEEN /HPF (ref 0–2)
SQUAMOUS EPITHELIAL / LPF: NONE SEEN /HPF (ref <=5)
Specific Gravity, Urine: 1.022 (ref 1.001–1.03)
WBC UA: NONE SEEN /HPF (ref 0–5)
pH: 5.5 (ref 5.0–8.0)

## 2017-03-30 LAB — NO CULTURE INDICATED

## 2017-04-07 ENCOUNTER — Telehealth: Payer: Self-pay | Admitting: Cardiology

## 2017-04-07 NOTE — Telephone Encounter (Signed)
°  Pt c/o Syncope: STAT if syncope occurred within 30 minutes and pt complains of lightheadedness High Priority if episode of passing out, completely, today or in last 24 hours   1. Did you pass out today? no  2. When is the last time you passed out? no  3. Has this occurred multiple times? yes  4. Did you have any symptoms prior to passing out? No   Pt verbalized tha tshe is weak and dizzy

## 2017-04-07 NOTE — Telephone Encounter (Signed)
Pt reports dizziness and weakness "off and on" for last week. "Not occurring a lot", "longer duration", but "less severe". Palpitations picked up a little bit, but she thinks this is usual w her menstrual period. No chest pain, no SOB or symptoms she feels are emergent.   She notes she's had this in the past, saw Dr. Martinique earlier in the fall for same Notes last time it happened, seemed to surround a stressful situation - (her husband was in hospital)  Recently, her mother was admitted to hospital for a heart attack - her mother had only had preceding symptoms of dizziness and lightheadedness.  Consequently, pt worried about her own symptoms. She states her own symptoms started occurring again soon after mother's heart attack...pt thinks related to stress/anxiety.  States VS OK - recalls systolic 338-250 when checked, she doesn't have HR readings on-hand, but will bring readings/log to appt.  I have scheduled her to see Dr. Martinique tomorrow to discuss. Pt aware to call if new concerns in interim.

## 2017-04-07 NOTE — Progress Notes (Signed)
Cardiology Office Note   Date:  04/08/2017   ID:  Faith Miller, DOB [birth year], MRN [id number]  PCP:  Jilda Panda, MD  Cardiologist:   Ithzel Fedorchak [place], MD   Chief Complaint  Patient presents with  . Palpitations      History of Present Illness: Faith Miller is a 51 y.o. female who is seen for follow up  of HTN and palpitations. She has a history of asthma, HTN and HLD. She was last seen by me in January 2014 for evaluation of palpitations. Event monitor was normal. Echo done in 2013 showed mild mitral regurgitation, otherwise normal.  She presented last month with symptoms of  dizziness and weakness along with her palpitations. This was new. She still has sporadic palpitations- like a "flip-flop". Notices this mostly when lying down. She states she had been started on chlorthalidone for BP and when she reduced the dose to 1/2 tablet daily the weakness resolved. I felt her chest pain was musculoskeletal. Her chlorthalidone was discontinued and we started her on Bystolic. When seen 3 weeks ago she was doing well on this. BP ranging from 120-130/70-80. No more flip-flopping sensation but does have very brief flutters. Feels better on this medication with improved energy and no cold sensitivity.   Today she returns with symptoms of weakness, palpitations and pain in her left arm. Symptoms of fluttering are fairly mild and worse when she lies on her left side. She is more fatigued. Her mother was just admitted with an MI this past weekend and had more symptoms of weakness as well. This along with the fact that her husband had an MI earlier this year really has her worried. No prior ischemic work up. Positive family history with father dying of MI in early 70s.    Past Medical History:  Diagnosis Date  . Asthma   . History of elevated lipids   . Hypertension   . IC (interstitial cystitis)     Past Surgical History:  Procedure Laterality Date  . GALLBLADDER SURGERY    .  WISDOM TOOTH EXTRACTION       Current Outpatient Medications  Medication Sig Dispense Refill  . albuterol (PROVENTIL HFA;VENTOLIN HFA) 108 (90 Base) MCG/ACT inhaler Inhale 2 puffs into the lungs every 6 (six) hours as needed for wheezing or shortness of breath.    . Estradiol 10 MCG TABS vaginal tablet Place in vagina nightly for 2 weeks and then twice weekly 28 tablet 11  . ibuprofen (ADVIL,MOTRIN) 200 MG tablet Take 200 mg by mouth every 6 (six) hours as needed for moderate pain.    . mometasone (ASMANEX) 220 MCG/INH inhaler Inhale 2 puffs into the lungs daily as needed (shortness of breath).     . montelukast (SINGULAIR) 10 MG tablet Take 10 mg by mouth at bedtime.    . nebivolol (BYSTOLIC) 5 MG tablet Take 1 tablet (5 mg total) by mouth daily. 30 tablet 11   No current facility-administered medications for this visit.     Allergies:   Aspirin    Social History:  The patient  reports that  has never smoked. she has never used smokeless tobacco. She reports that she drinks alcohol. She reports that she does not use drugs.   Family History:  The patient's family history includes Breast cancer in her maternal grandmother; Cancer in her paternal grandmother; Heart disease in her father and mother; Hypertension in her father and mother.    ROS:  Please see the history of  present illness.   Otherwise, review of systems are positive for none.   All other systems are reviewed and negative.    PHYSICAL EXAM: VS:  BP (!) 160/98   Pulse 72   Ht 5\' 2"  (1.575 m)   Wt 134 lb 3.2 oz (60.9 kg)   LMP 03/26/2017   SpO2 99%   BMI 24.55 kg/m  , BMI Body mass index is 24.55 kg/m. GENERAL:  Well appearing HEENT:  PERRL, EOMI, sclera are clear. Oropharynx is clear. NECK:  No jugular venous distention, carotid upstroke brisk and symmetric, no bruits, no thyromegaly or adenopathy LUNGS:  Clear to auscultation bilaterally CHEST:  Unremarkable HEART:  RRR,  PMI not displaced or sustained,S1 and S2  within normal limits, no S3, no S4: no clicks, no rubs, no murmurs ABD:  Soft, nontender. BS +, no masses or bruits. No hepatomegaly, no splenomegaly EXT:  2 + pulses throughout, no edema, no cyanosis no clubbing SKIN:  Warm and dry.  No rashes NEURO:  Alert and oriented x 3. Cranial nerves II through XII intact. PSYCH:  Cognitively intact       EKG:  EKG is  ordered today. NSR with borderline prolonged QT. QTc 484 msec. I have personally reviewed and interpreted this study.      Recent Labs: No results found for requested labs within last 8760 hours.    Lipid Panel No results found for: CHOL, TRIG, HDL, CHOLHDL, VLDL, LDLCALC, LDLDIRECT    Wt Readings from Last 3 Encounters:  04/08/17 134 lb 3.2 oz (60.9 kg)  03/29/17 138 lb (62.6 kg)  03/18/17 137 lb 9.6 oz (62.4 kg)      Other studies Reviewed: Additional studies/ records that were reviewed today include: none   ASSESSMENT AND PLAN:  1.  Palpitations. Chronic and improved on Bystolic. Recent increase due to family stressors.  Prior evaluation in 2014 unremarkable. Continue beta blocker therapy.  2. Weakness and dizziness associated with atypical chest and left arm symptoms. Given family history will schedule for a stress Echo.   3. HTN. Elevated today but very stressed. Normal reading 3 weeks ago.     Current medicines are reviewed at length with the patient today.  The patient does not have concerns regarding medicines.  The following changes have been made:  See above  Labs/ tests ordered today include: Stress echo  No orders of the defined types were placed in this encounter.   Signed, Oretta Berkland [place], MD  04/08/2017 3:11 PM    [facility] [address] [place], [place], [address] Phone [phone], Fax [phone]

## 2017-04-07 NOTE — Telephone Encounter (Signed)
OK with plan  Ashmi Blas [place] MD, [facility]

## 2017-04-08 ENCOUNTER — Encounter: Payer: Self-pay | Admitting: Cardiology

## 2017-04-08 ENCOUNTER — Ambulatory Visit: Payer: 59 | Admitting: Cardiology

## 2017-04-08 VITALS — BP 160/98 | HR 72 | Ht 62.0 in | Wt 134.2 lb

## 2017-04-08 DIAGNOSIS — R002 Palpitations: Secondary | ICD-10-CM

## 2017-04-08 DIAGNOSIS — I10 Essential (primary) hypertension: Secondary | ICD-10-CM | POA: Diagnosis not present

## 2017-04-08 DIAGNOSIS — R0789 Other chest pain: Secondary | ICD-10-CM

## 2017-04-08 NOTE — Patient Instructions (Signed)
Schedule Stress Echo

## 2017-04-13 DIAGNOSIS — Q825 Congenital non-neoplastic nevus: Secondary | ICD-10-CM | POA: Diagnosis not present

## 2017-04-13 DIAGNOSIS — D2272 Melanocytic nevi of left lower limb, including hip: Secondary | ICD-10-CM | POA: Diagnosis not present

## 2017-04-13 DIAGNOSIS — D225 Melanocytic nevi of trunk: Secondary | ICD-10-CM | POA: Diagnosis not present

## 2017-04-13 DIAGNOSIS — D2339 Other benign neoplasm of skin of other parts of face: Secondary | ICD-10-CM | POA: Diagnosis not present

## 2017-04-13 DIAGNOSIS — L821 Other seborrheic keratosis: Secondary | ICD-10-CM | POA: Diagnosis not present

## 2017-04-13 DIAGNOSIS — Z23 Encounter for immunization: Secondary | ICD-10-CM | POA: Diagnosis not present

## 2017-04-13 DIAGNOSIS — D22121 Melanocytic nevi of left upper eyelid, including canthus: Secondary | ICD-10-CM | POA: Diagnosis not present

## 2017-04-15 ENCOUNTER — Telehealth (HOSPITAL_COMMUNITY): Payer: Self-pay | Admitting: *Deleted

## 2017-04-15 NOTE — Telephone Encounter (Signed)
Patient given detailed instructions per Stress Test Requisition Sheet for test on 04/21/2017 at 7:30.Patient Notified to arrive 30 minutes early, and that it is imperative to arrive on time for appointment to keep from having the test rescheduled.  Patient verbalized understanding. Faith Miller

## 2017-04-21 ENCOUNTER — Other Ambulatory Visit (HOSPITAL_COMMUNITY): Payer: 59

## 2017-04-28 ENCOUNTER — Telehealth (HOSPITAL_COMMUNITY): Payer: Self-pay | Admitting: *Deleted

## 2017-04-28 NOTE — Telephone Encounter (Signed)
Patient called and given instructions for stress echo on 05/06/17.  Faith Miller

## 2017-05-06 ENCOUNTER — Ambulatory Visit (HOSPITAL_COMMUNITY): Payer: 59 | Attending: Cardiovascular Disease

## 2017-05-06 ENCOUNTER — Ambulatory Visit (HOSPITAL_COMMUNITY): Payer: 59

## 2017-05-06 DIAGNOSIS — E785 Hyperlipidemia, unspecified: Secondary | ICD-10-CM | POA: Insufficient documentation

## 2017-05-06 DIAGNOSIS — I10 Essential (primary) hypertension: Secondary | ICD-10-CM | POA: Diagnosis not present

## 2017-05-06 DIAGNOSIS — R002 Palpitations: Secondary | ICD-10-CM | POA: Insufficient documentation

## 2017-05-06 DIAGNOSIS — Z8249 Family history of ischemic heart disease and other diseases of the circulatory system: Secondary | ICD-10-CM | POA: Insufficient documentation

## 2017-05-06 DIAGNOSIS — R42 Dizziness and giddiness: Secondary | ICD-10-CM | POA: Insufficient documentation

## 2017-05-06 DIAGNOSIS — J45909 Unspecified asthma, uncomplicated: Secondary | ICD-10-CM | POA: Diagnosis not present

## 2017-05-06 DIAGNOSIS — I959 Hypotension, unspecified: Secondary | ICD-10-CM | POA: Diagnosis not present

## 2017-05-06 DIAGNOSIS — R0789 Other chest pain: Secondary | ICD-10-CM | POA: Insufficient documentation

## 2017-05-06 MED ORDER — PERFLUTREN LIPID MICROSPHERE
1.0000 mL | INTRAVENOUS | Status: AC | PRN
  Administered 2017-05-06 (×3): 2 mL via INTRAVENOUS

## 2017-05-10 DIAGNOSIS — R002 Palpitations: Secondary | ICD-10-CM | POA: Diagnosis not present

## 2017-05-10 DIAGNOSIS — I119 Hypertensive heart disease without heart failure: Secondary | ICD-10-CM | POA: Diagnosis not present

## 2017-05-19 ENCOUNTER — Encounter: Payer: Self-pay | Admitting: Podiatry

## 2017-05-19 ENCOUNTER — Ambulatory Visit: Payer: 59 | Admitting: Podiatry

## 2017-05-19 VITALS — BP 165/96 | HR 65 | Ht 62.0 in | Wt 138.0 lb

## 2017-05-19 DIAGNOSIS — L609 Nail disorder, unspecified: Secondary | ICD-10-CM | POA: Diagnosis not present

## 2017-05-19 NOTE — Progress Notes (Signed)
   Subjective:    Patient ID: Faith Miller, female    DOB: 05-Apr-1966, 52 y.o.   MRN: [id number]  HPI  Chief Complaint  Patient presents with  . Nail Problem    (NP) right foot has a spot in the middle of toenail       Review of Systems  All other systems reviewed and are negative.      Objective:   Physical Exam        Assessment & Plan:

## 2017-05-23 NOTE — Progress Notes (Signed)
   HPI: 52 year old female presenting today as a new patient with a chief complaint of a lesion on the right great toenail that has been present for  3 months.  She states she accidentally kicked an exercise bike.  She denies bruising or pain.  She is concerned due to previous dermatology issues.  Patient is here for further evaluation and treatment.   Past Medical History:  Diagnosis Date  . Asthma   . History of elevated lipids   . Hypertension   . IC (interstitial cystitis)      Physical Exam: General: The patient is alert and oriented x3 in no acute distress.  Dermatology: Focal area of erythema with compression of the nail plate of the right great toenail.  Skin is warm, dry and supple bilateral lower extremities. Negative for open lesions or macerations.  Vascular: Palpable pedal pulses bilaterally. No edema or erythema noted. Capillary refill within normal limits.  Neurological: Epicritic and protective threshold grossly intact bilaterally.   Musculoskeletal Exam: Range of motion within normal limits to all pedal and ankle joints bilateral. Muscle strength 5/5 in all groups bilateral.    Assessment: -Nail lesion right great toenail   Plan of Care:  -Patient evaluated. -Explained to patient it is not malignant.  Should go away with time. -Recommended good shoe gear. -Return to clinic as needed.   Edrick Kins, DPM [facility]  Dr. Edrick Kins, DPM    [address], [place] [address]                Office [phone]  Fax [phone]

## 2017-05-28 MED FILL — YUVAFEM 10 MCG VAGINAL INSE: 10 | 28 days supply | Qty: 18 | Fill #0

## 2017-05-31 MED FILL — BYSTOLIC 5 MG TABLET: 5 | 90 days supply | Qty: 90 | Fill #1

## 2017-06-21 MED FILL — MONTELUKAST SOD 10 MG TAB: 10 | 30 days supply | Qty: 30 | Fill #0

## 2017-06-23 ENCOUNTER — Other Ambulatory Visit: Payer: Self-pay | Admitting: Women's Health

## 2017-06-23 DIAGNOSIS — Z1231 Encounter for screening mammogram for malignant neoplasm of breast: Secondary | ICD-10-CM

## 2017-07-06 DIAGNOSIS — R070 Pain in throat: Secondary | ICD-10-CM | POA: Diagnosis not present

## 2017-07-07 MED FILL — PROAIR HFA 90 MCG INHALER: 108 (90 BAS | 75 days supply | Qty: 26 | Fill #0

## 2017-07-13 ENCOUNTER — Ambulatory Visit
Admission: RE | Admit: 2017-07-13 | Discharge: 2017-07-13 | Disposition: A | Discharge status: Home / Self Care | Payer: 59 | Source: Ambulatory Visit | Attending: Women's Health | Admitting: Women's Health

## 2017-07-13 ENCOUNTER — Encounter: Payer: Self-pay | Admitting: Women's Health

## 2017-07-13 DIAGNOSIS — Z1231 Encounter for screening mammogram for malignant neoplasm of breast: Secondary | ICD-10-CM

## 2017-07-13 MED FILL — ASMANEX TWISTHALER 220 MCG: 220 | 30 days supply | Qty: 1 | Fill #0

## 2017-08-09 DIAGNOSIS — E7800 Pure hypercholesterolemia, unspecified: Secondary | ICD-10-CM | POA: Diagnosis not present

## 2017-08-09 DIAGNOSIS — I119 Hypertensive heart disease without heart failure: Secondary | ICD-10-CM | POA: Diagnosis not present

## 2017-08-31 DIAGNOSIS — R21 Rash and other nonspecific skin eruption: Secondary | ICD-10-CM | POA: Diagnosis not present

## 2017-09-03 MED FILL — BYSTOLIC 5 MG TABLET: 5 | 90 days supply | Qty: 90 | Fill #2

## 2017-09-07 NOTE — Progress Notes (Deleted)
Cardiology Office Note   Date:  09/07/2017   ID:  Jaquelin Meaney, DOB Jan 29, 1966, MRN [id number]  PCP:  Jilda Panda, MD  Cardiologist:   Grey Schlauch [place], MD   No chief complaint on file.     History of Present Illness: Faith Miller is a 52 y.o. female who is seen for follow up  of HTN and palpitations. She has a history of asthma, HTN and HLD. She was last seen by me in January 2014 for evaluation of palpitations. Event monitor was normal. Echo done in 2013 showed mild mitral regurgitation, otherwise normal. She had a normal Stress Echo in December 2018.   She presented last month with symptoms of  dizziness and weakness along with her palpitations. This was new. She still has sporadic palpitations- like a "flip-flop". Notices this mostly when lying down. She states she had been started on chlorthalidone for BP and when she reduced the dose to 1/2 tablet daily the weakness resolved. I felt her chest pain was musculoskeletal. Her chlorthalidone was discontinued and we started her on Bystolic. When seen 3 weeks ago she was doing well on this. BP ranging from 120-130/70-80. No more flip-flopping sensation but does have very brief flutters. Feels better on this medication with improved energy and no cold sensitivity.   Today she returns with symptoms of weakness, palpitations and pain in her left arm. Symptoms of fluttering are fairly mild and worse when she lies on her left side. She is more fatigued. Her mother was just admitted with an MI this past weekend and had more symptoms of weakness as well. This along with the fact that her husband had an MI earlier this year really has her worried. No prior ischemic work up. Positive family history with father dying of MI in early 53s.    Past Medical History:  Diagnosis Date  . Asthma   . History of elevated lipids   . Hypertension   . IC (interstitial cystitis)     Past Surgical History:  Procedure Laterality Date  . GALLBLADDER  SURGERY    . WISDOM TOOTH EXTRACTION       Current Outpatient Medications  Medication Sig Dispense Refill  . albuterol (PROVENTIL HFA;VENTOLIN HFA) 108 (90 Base) MCG/ACT inhaler Inhale 2 puffs into the lungs every 6 (six) hours as needed for wheezing or shortness of breath.    . Estradiol 10 MCG TABS vaginal tablet Place in vagina nightly for 2 weeks and then twice weekly 28 tablet 11  . ibuprofen (ADVIL,MOTRIN) 200 MG tablet Take 200 mg by mouth every 6 (six) hours as needed for moderate pain.    . mometasone (ASMANEX) 220 MCG/INH inhaler Inhale 2 puffs into the lungs daily as needed (shortness of breath).     . montelukast (SINGULAIR) 10 MG tablet Take 10 mg by mouth at bedtime.    . nebivolol (BYSTOLIC) 5 MG tablet Take 1 tablet (5 mg total) by mouth daily. 30 tablet 11   No current facility-administered medications for this visit.     Allergies:   Aspirin    Social History:  The patient  reports that she has never smoked. She has never used smokeless tobacco. She reports that she drinks alcohol. She reports that she does not use drugs.   Family History:  The patient's family history includes Breast cancer in her maternal grandmother; Cancer in her paternal grandmother; Heart disease in her father and mother; Hypertension in her father and mother.    ROS:  Please see the history of present illness.   Otherwise, review of systems are positive for none.   All other systems are reviewed and negative.    PHYSICAL EXAM: VS:  There were no vitals taken for this visit. , BMI There is no height or weight on file to calculate BMI. GENERAL:  Well appearing HEENT:  PERRL, EOMI, sclera are clear. Oropharynx is clear. NECK:  No jugular venous distention, carotid upstroke brisk and symmetric, no bruits, no thyromegaly or adenopathy LUNGS:  Clear to auscultation bilaterally CHEST:  Unremarkable HEART:  RRR,  PMI not displaced or sustained,S1 and S2 within normal limits, no S3, no S4: no  clicks, no rubs, no murmurs ABD:  Soft, nontender. BS +, no masses or bruits. No hepatomegaly, no splenomegaly EXT:  2 + pulses throughout, no edema, no cyanosis no clubbing SKIN:  Warm and dry.  No rashes NEURO:  Alert and oriented x 3. Cranial nerves II through XII intact. PSYCH:  Cognitively intact  EKG:  EKG is  ordered today. NSR with borderline prolonged QT. QTc 484 msec. I have personally reviewed and interpreted this study.  Recent Labs: No results found for requested labs within last 8760 hours.    Lipid Panel No results found for: CHOL, TRIG, HDL, CHOLHDL, VLDL, LDLCALC, LDLDIRECT    Wt Readings from Last 3 Encounters:  05/19/17 138 lb (62.6 kg)  04/08/17 134 lb 3.2 oz (60.9 kg)  03/29/17 138 lb (62.6 kg)      Other studies Reviewed: Additional studies/ records that were reviewed today include: none  Stress Echo 05/06/17: Study Conclusions  - Stress ECG conclusions: The stress ECG was normal. - Staged echo: Normal echo stress  Impressions:  - Normal study after maximal exercise.  ASSESSMENT AND PLAN:  1.  Palpitations. Chronic and improved on Bystolic. Recent increase due to family stressors.  Prior evaluation in 2014 unremarkable. Continue beta blocker therapy.  2. Weakness and dizziness associated with atypical chest and left arm symptoms. Given family history will schedule for a stress Echo.   3. HTN. Elevated today but very stressed. Normal reading 3 weeks ago.     Current medicines are reviewed at length with the patient today.  The patient does not have concerns regarding medicines.  The following changes have been made:  See above  Labs/ tests ordered today include: Stress echo  No orders of the defined types were placed in this encounter.   Signed, Sondos Wolfman [place], MD  09/07/2017 1:14 PM    [facility] [address], [place], [place], [address] Phone [phone], Fax [phone]

## 2017-09-09 ENCOUNTER — Ambulatory Visit: Payer: 59 | Admitting: Cardiology

## 2017-09-14 DIAGNOSIS — Z86018 Personal history of other benign neoplasm: Secondary | ICD-10-CM | POA: Diagnosis not present

## 2017-09-14 DIAGNOSIS — L821 Other seborrheic keratosis: Secondary | ICD-10-CM | POA: Diagnosis not present

## 2017-09-14 DIAGNOSIS — D2262 Melanocytic nevi of left upper limb, including shoulder: Secondary | ICD-10-CM | POA: Diagnosis not present

## 2017-09-14 DIAGNOSIS — D485 Neoplasm of uncertain behavior of skin: Secondary | ICD-10-CM | POA: Diagnosis not present

## 2017-09-14 DIAGNOSIS — D1800 Hemangioma unspecified site: Secondary | ICD-10-CM | POA: Diagnosis not present

## 2017-09-14 DIAGNOSIS — D2272 Melanocytic nevi of left lower limb, including hip: Secondary | ICD-10-CM | POA: Diagnosis not present

## 2017-09-14 DIAGNOSIS — D225 Melanocytic nevi of trunk: Secondary | ICD-10-CM | POA: Diagnosis not present

## 2017-09-14 DIAGNOSIS — D2261 Melanocytic nevi of right upper limb, including shoulder: Secondary | ICD-10-CM | POA: Diagnosis not present

## 2017-09-15 MED FILL — MONTELUKAST SOD 10 MG TAB: 10 | 30 days supply | Qty: 30 | Fill #1

## 2017-10-13 ENCOUNTER — Ambulatory Visit: Payer: 59 | Admitting: Women's Health

## 2017-10-13 ENCOUNTER — Encounter: Payer: Self-pay | Admitting: Women's Health

## 2017-10-13 VITALS — BP 124/78

## 2017-10-13 DIAGNOSIS — F329 Major depressive disorder, single episode, unspecified: Secondary | ICD-10-CM

## 2017-10-13 DIAGNOSIS — N951 Menopausal and female climacteric states: Secondary | ICD-10-CM

## 2017-10-13 DIAGNOSIS — F419 Anxiety disorder, unspecified: Secondary | ICD-10-CM

## 2017-10-13 DIAGNOSIS — F32A Depression, unspecified: Secondary | ICD-10-CM

## 2017-10-13 NOTE — Patient Instructions (Addendum)
Living With Anxiety After being diagnosed with an anxiety disorder, you may be relieved to know why you have felt or behaved a certain way. It is natural to also feel overwhelmed about the treatment ahead and what it will mean for your life. With care and support, you can manage this condition and recover from it. How to cope with anxiety Dealing with stress Stress is your body's reaction to life changes and events, both good and bad. Stress can last just a few hours or it can be ongoing. Stress can play a major role in anxiety, so it is important to learn both how to cope with stress and how to think about it differently. Talk with your health care provider or a counselor to learn more about stress reduction. He or she may suggest some stress reduction techniques, such as:  Music therapy. This can include creating or listening to music that you enjoy and that inspires you.  Mindfulness-based meditation. This involves being aware of your normal breaths, rather than trying to control your breathing. It can be done while sitting or walking.  Centering prayer. This is a kind of meditation that involves focusing on a word, phrase, or sacred image that is meaningful to you and that brings you peace.  Deep breathing. To do this, expand your stomach and inhale slowly through your nose. Hold your breath for 3-5 seconds. Then exhale slowly, allowing your stomach muscles to relax.  Self-talk. This is a skill where you identify thought patterns that lead to anxiety reactions and correct those thoughts.  Muscle relaxation. This involves tensing muscles then relaxing them.  Choose a stress reduction technique that fits your lifestyle and personality. Stress reduction techniques take time and practice. Set aside 5-15 minutes a day to do them. Therapists can offer training in these techniques. The training may be covered by some insurance plans. Other things you can do to manage stress include:  Keeping a  stress diary. This can help you learn what triggers your stress and ways to control your response.  Thinking about how you respond to certain situations. You may not be able to control everything, but you can control your reaction.  Making time for activities that help you relax, and not feeling guilty about spending your time in this way.  Therapy combined with coping and stress-reduction skills provides the best chance for successful treatment. Medicines Medicines can help ease symptoms. Medicines for anxiety include:  Anti-anxiety drugs.  Antidepressants.  Beta-blockers.  Medicines may be used as the main treatment for anxiety disorder, along with therapy, or if other treatments are not working. Medicines should be prescribed by a health care provider. Relationships Relationships can play a big part in helping you recover. Try to spend more time connecting with trusted friends and family members. Consider going to couples counseling, taking family education classes, or going to family therapy. Therapy can help you and others better understand the condition. How to recognize changes in your condition Everyone has a different response to treatment for anxiety. Recovery from anxiety happens when symptoms decrease and stop interfering with your daily activities at home or work. This may mean that you will start to:  Have better concentration and focus.  Sleep better.  Be less irritable.  Have more energy.  Have improved memory.  It is important to recognize when your condition is getting worse. Contact your health care provider if your symptoms interfere with home or work and you do not feel like your condition   is improving. Where to find help and support: You can get help and support from these sources:  Self-help groups.  Online and [employer].  A trusted spiritual leader.  Couples counseling.  Family education classes.  Family therapy.  Follow these  instructions at home:  Eat a healthy diet that includes plenty of vegetables, fruits, whole grains, low-fat dairy products, and lean protein. Do not eat a lot of foods that are high in solid fats, added sugars, or salt.  Exercise. Most adults should do the following: ? Exercise for at least 150 minutes each week. The exercise should increase your heart rate and make you sweat (moderate-intensity exercise). ? Strengthening exercises at least twice a week.  Cut down on caffeine, tobacco, alcohol, and other potentially harmful substances.  Get the right amount and quality of sleep. Most adults need 7-9 hours of sleep each night.  Make choices that simplify your life.  Take over-the-counter and prescription medicines only as told by your health care provider.  Avoid caffeine, alcohol, and certain over-the-counter cold medicines. These may make you feel worse. Ask your pharmacist which medicines to avoid.  Keep all follow-up visits as told by your health care provider. This is important. Questions to ask your health care provider  Would I benefit from therapy?  How often should I follow up with a health care provider?  How long do I need to take medicine?  Are there any long-term side effects of my medicine?  Are there any alternatives to taking medicine? Contact a health care provider if:  You have a hard time staying focused or finishing daily tasks.  You spend many hours a day feeling worried about everyday life.  You become exhausted by worry.  You start to have headaches, feel tense, or have nausea.  You urinate more than normal.  You have diarrhea. Get help right away if:  You have a racing heart and shortness of breath.  You have thoughts of hurting yourself or others. If you ever feel like you may hurt yourself or others, or have thoughts about taking your own life, get help right away. You can go to your nearest emergency department or call:  Your local emergency  services (911 in the [place]  A suicide crisis helpline, such as the [place] at (304)358-4402. This is open 24-hours a day.  Summary  Taking steps to deal with stress can help calm you.  Medicines cannot cure anxiety disorders, but they can help ease symptoms.  Family, friends, and partners can play a big part in helping you recover from an anxiety disorder. This information is not intended to replace advice given to you by your health care provider. Make sure you discuss any questions you have with your health care provider. Document Released: 04/21/2016 Document Revised: 04/21/2016 Document Reviewed: 04/21/2016 Elsevier Interactive Patient Education  2018 [employer]. Depression Screening Depression screening is a tool that your health care provider can use to learn if you have symptoms of depression. Depression is a common condition with many symptoms that are also often found in other conditions. Depression is treatable, but it must first be diagnosed. You may not know that certain feelings, thoughts, and behaviors that you are having can be symptoms of depression. Taking a depression screening test can help you and your health care provider decide if you need more assessment, or if you should be referred to a mental health care provider. What are the screening tests?  You may have  a physical exam to see if another condition is affecting your mental health. You may have a blood or urine sample taken during the physical exam.  You may be interviewed using a screening tool that was developed from research, such as one of these: ? Patient Health Questionnaire (PHQ). This is a set of either 2 or 9 questions. A health care provider who has been trained to score this screening test uses a guide to assess if your symptoms suggest that you may have depression. ? Hamilton Depression Rating Scale (HAM-D). This is a set of either 17 or 24 questions. You may be asked to  take it again during or after your treatment, to see if your depression has gotten better. ? Beck Depression Inventory (BDI). This is a set of 21 multiple choice questions. Your health care provider scores your answers to assess:  Your level of depression, ranging from mild to severe.  Your response to treatment.  Your health care provider may talk with you about your daily activities, such as eating, sleeping, work, and recreation, and ask if you have had any changes in activity.  Your health care provider may ask you to see a mental health specialist, such as a psychiatrist or psychologist, for more evaluation. Who should be screened for depression?  All adults, including adults with a family history of a mental health disorder.  Adolescents who are 63-41 years old.  People who are recovering from a myocardial infarction (MI).  Pregnant women, or women who have given birth.  People who have a long-term (chronic) illness.  Anyone who has been diagnosed with another type of a mental health disorder.  Anyone who has symptoms that could show depression. What do my results mean? Your health care provider will review the results of your depression screening, physical exam, and lab tests. Positive screens suggest that you may have depression. Screening is the first step in getting the care that you may need. It is up to you to get your screening results. Ask your health care provider, or the department that is doing your screening tests, when your results will be ready. Talk with your health care provider about your results and diagnosis. A diagnosis of depression is made using the Diagnostic and Statistical Manual of Mental Disorders (DSM-V). This is a book that lists the number and type of symptoms that must be present for a health care provider to give a specific diagnosis.  Your health care provider may work with you to treat your symptoms of depression, or your health care provider may  help you find a mental health provider who can assess, diagnose, and treat your depression. Get help right away if:  You have thoughts about hurting yourself or others. If you ever feel like you may hurt yourself or others, or have thoughts about taking your own life, get help right away. You can go to your nearest emergency department or call:  Your local emergency services (911 in the [place]  A suicide crisis helpline, such as the [place] at (216)089-9363. This is open 24 hours a day.  Summary  Depression screening is the first step in getting the help that you may need.  If your screening test shows symptoms of depression (is positive), your health care provider may ask you to see a mental health provider.  Anyone who is age 29 or older should be screened for depression. This information is not intended to replace advice given to you by your  health care provider. Make sure you discuss any questions you have with your health care provider. Document Released: 09/11/2016 Document Revised: 09/11/2016 Document Reviewed: 09/11/2016 Elsevier Interactive Patient Education  2018 [employer].

## 2017-10-13 NOTE — Progress Notes (Signed)
Faith Miller 1965-09-26 [id number]  History: 52 y.o. MWF G3P3 presents with increasing fatigue, occasional  palpitations, cramping, nausea, diarrhea, sleeping difficulties. The symptoms used to be more cyclic with her menstraution, and have become more constant for the last month and a half. She is using Melatonin every night to sleep, which is effective. Overall, her mood, libido and relationship with her husband has improved since she was last here.  Questions if she is menopausal.  3 daughters in high school. Two of her daughters- [name] and Derrick Ravel are having some mental health issues including anxiety and depression. Daughters are seeing family friend who is a Transport planner.  Her cycles have regulated more in length from 26-43 days to in the last year 26-29 days on average. History of hypertension primary care managing. Normal Pap and mammogram history.  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  Exam:  Vitals:   10/13/17 1409  BP: 124/78   There is no height or weight on file to calculate BMI.   General appearance:  Normal, alert and oriented, well-developed well-nourished.    Assessment/Plan:  52 y.o. MWF G3P3   Anxiety and Depression HTN- managed by Primary care physician Perimenopausal /PMS/mood changes/rhythm method  Discussed possible low-dose antidepressant, declines, encouraged counseling, states will speak with her family friend who is a therapist about anxiety and depression symptoms. Reviewed signs and symptoms of anxiety and depression and treatment options.  Recommended to maintain a healthy diet, and regular exercise routine.  Encouraged leisure activities, and date nights with her husband.  Educated patient on signs and symptoms of menopause such as decreased cycles, decreased libido, and mood changes.   [name] [facility], 2:54 PM 10/13/2017

## 2017-11-04 DIAGNOSIS — A09 Infectious gastroenteritis and colitis, unspecified: Secondary | ICD-10-CM | POA: Diagnosis not present

## 2017-11-04 MED FILL — COLESTIPOL HCL 1 GM TABLET: 1 | 30 days supply | Qty: 60 | Fill #0

## 2017-11-15 DIAGNOSIS — I119 Hypertensive heart disease without heart failure: Secondary | ICD-10-CM | POA: Diagnosis not present

## 2017-11-15 DIAGNOSIS — Z0000 Encounter for general adult medical examination without abnormal findings: Secondary | ICD-10-CM | POA: Diagnosis not present

## 2017-11-15 DIAGNOSIS — E7800 Pure hypercholesterolemia, unspecified: Secondary | ICD-10-CM | POA: Diagnosis not present

## 2017-11-15 DIAGNOSIS — E559 Vitamin D deficiency, unspecified: Secondary | ICD-10-CM | POA: Diagnosis not present

## 2017-11-15 DIAGNOSIS — Z6823 Body mass index (BMI) 23.0-23.9, adult: Secondary | ICD-10-CM | POA: Diagnosis not present

## 2017-12-02 MED FILL — BYSTOLIC 5 MG TABLET: 5 | 90 days supply | Qty: 90 | Fill #3

## 2017-12-27 DIAGNOSIS — I119 Hypertensive heart disease without heart failure: Secondary | ICD-10-CM | POA: Diagnosis not present

## 2017-12-27 DIAGNOSIS — M79662 Pain in left lower leg: Secondary | ICD-10-CM | POA: Diagnosis not present

## 2018-01-02 NOTE — Progress Notes (Signed)
Cardiology Office Note   Date:  01/07/2018   ID:  Faith Miller, DOB 1966-01-15, MRN [id number]  PCP:  Jilda Panda, MD  Cardiologist:   Peter Martinique, MD   Chief Complaint  Patient presents with  . Palpitations      History of Present Illness: Faith Miller is a 52 y.o. female who is seen for follow up  of HTN and palpitations. She has a history of asthma, HTN and HLD. She was seen initially in January 2014 for evaluation of palpitations. Event monitor was normal. Echo done in 2013 showed mild mitral regurgitation, otherwise normal.  She was taking a diuretic for BP but this made her feel fatigued- like wearing a lead suit. She was having more palpitations. We switched her to Bystolic and she really likes this. Feels great. Palpitations are well controlled.   Positive family history with father dying of MI in early 82s.    Past Medical History:  Diagnosis Date  . Asthma   . History of elevated lipids   . Hypertension   . IC (interstitial cystitis)     Past Surgical History:  Procedure Laterality Date  . GALLBLADDER SURGERY    . WISDOM TOOTH EXTRACTION       Current Outpatient Medications  Medication Sig Dispense Refill  . albuterol (PROVENTIL HFA;VENTOLIN HFA) 108 (90 Base) MCG/ACT inhaler Inhale 2 puffs into the lungs every 6 (six) hours as needed for wheezing or shortness of breath.    Marland Kitchen ibuprofen (ADVIL,MOTRIN) 200 MG tablet Take 200 mg by mouth every 6 (six) hours as needed for moderate pain.    . mometasone (ASMANEX) 220 MCG/INH inhaler Inhale 2 puffs into the lungs daily as needed (shortness of breath).     . montelukast (SINGULAIR) 10 MG tablet Take 10 mg by mouth at bedtime.    . nebivolol (BYSTOLIC) 5 MG tablet Take 1 tablet (5 mg total) by mouth daily. 30 tablet 11   No current facility-administered medications for this visit.     Allergies:   Aspirin    Social History:  The patient  reports that she has never smoked. She has never used  smokeless tobacco. She reports that she drinks alcohol. She reports that she does not use drugs.   Family History:  The patient's family history includes Breast cancer in her maternal grandmother; Cancer in her paternal grandmother; Heart disease in her father and mother; Hypertension in her father and mother.    ROS:  Please see the history of present illness.   Otherwise, review of systems are positive for none.   All other systems are reviewed and negative.    PHYSICAL EXAM: VS:  BP 130/86   Pulse 64   Ht 5\' 2"  (1.575 m)   Wt 129 lb 8 oz (58.7 kg)   BMI 23.69 kg/m  , BMI Body mass index is 23.69 kg/m. GENERAL:  Well appearing WF in NAD HEENT:  PERRL, EOMI, sclera are clear. Oropharynx is clear. NECK:  No jugular venous distention, carotid upstroke brisk and symmetric, no bruits, no thyromegaly or adenopathy LUNGS:  Clear to auscultation bilaterally CHEST:  Unremarkable HEART:  RRR,  PMI not displaced or sustained,S1 and S2 within normal limits, no S3, no S4: no clicks, no rubs, no murmurs ABD:  Soft, nontender. BS +, no masses or bruits. No hepatomegaly, no splenomegaly EXT:  2 + pulses throughout, no edema, no cyanosis no clubbing SKIN:  Warm and dry.  No rashes NEURO:  Alert and  oriented x 3. Cranial nerves II through XII intact. PSYCH:  Cognitively intact         EKG:  EKG is not  ordered today.    Recent Labs: No results found for requested labs within last 8760 hours.    Lipid Panel No results found for: CHOL, TRIG, HDL, CHOLHDL, VLDL, LDLCALC, LDLDIRECT    Wt Readings from Last 3 Encounters:  01/07/18 129 lb 8 oz (58.7 kg)  05/19/17 138 lb (62.6 kg)  04/08/17 134 lb 3.2 oz (60.9 kg)      Other studies Reviewed: Additional studies/ records that were reviewed today include:   Stress Echo 05/01/17: Study Conclusions  - Stress ECG conclusions: The stress ECG was normal. - Staged echo: Normal echo stress  Impressions:  - Normal study after  maximal exercise.   ASSESSMENT AND PLAN:  1.  Palpitations. Chronic and much improved on Bystolic.  Prior evaluation in 2014 unremarkable. Continue beta blocker therapy.  2. Weakness and dizziness associated with atypical chest and left arm symptoms. Normal stress Echo in December 2018  3. HTN. Well controlled   Follow up one year  Signed, Peter Martinique, MD  01/07/2018 4:02 PM    [place] [address], [place], [place], [address] Phone [phone], Fax [phone]

## 2018-01-07 ENCOUNTER — Ambulatory Visit: Payer: 59 | Admitting: Cardiology

## 2018-01-07 ENCOUNTER — Encounter: Payer: Self-pay | Admitting: Cardiology

## 2018-01-07 VITALS — BP 130/86 | HR 64 | Ht 62.0 in | Wt 129.5 lb

## 2018-01-07 DIAGNOSIS — I10 Essential (primary) hypertension: Secondary | ICD-10-CM

## 2018-01-07 DIAGNOSIS — R002 Palpitations: Secondary | ICD-10-CM | POA: Diagnosis not present

## 2018-01-07 NOTE — Patient Instructions (Signed)
Continue your current therapy  I will see you in one year   

## 2018-03-03 ENCOUNTER — Other Ambulatory Visit: Payer: Self-pay | Admitting: Cardiology

## 2018-03-03 DIAGNOSIS — I10 Essential (primary) hypertension: Secondary | ICD-10-CM

## 2018-03-03 MED FILL — BYSTOLIC 5 MG TABLET: 5 | 90 days supply | Qty: 90 | Fill #0

## 2018-03-03 MED FILL — MONTELUKAST SOD 10 MG TAB: 10 | 30 days supply | Qty: 30 | Fill #2

## 2018-03-15 ENCOUNTER — Telehealth: Payer: Self-pay | Admitting: *Deleted

## 2018-03-15 NOTE — Telephone Encounter (Signed)
Patient called c/o spotting between cycle, has cycle then spotting until now, much lighter now 1st time this has happened. Patient states appears spotting may stop, will watch for next month and continues follow up visit with provider.

## 2018-05-16 ENCOUNTER — Ambulatory Visit (INDEPENDENT_AMBULATORY_CARE_PROVIDER_SITE_OTHER): Payer: 59 | Admitting: Women's Health

## 2018-05-16 ENCOUNTER — Encounter: Payer: 59 | Admitting: Women's Health

## 2018-05-16 ENCOUNTER — Encounter: Payer: Self-pay | Admitting: Women's Health

## 2018-05-16 VITALS — BP 128/80 | Ht 62.0 in | Wt 135.0 lb

## 2018-05-16 DIAGNOSIS — Z01419 Encounter for gynecological examination (general) (routine) without abnormal findings: Secondary | ICD-10-CM

## 2018-05-16 NOTE — Progress Notes (Signed)
Glorious Flicker 12-11-1965 [id number]    History:    53 y.o MWF, G3P3 presents for annual exam having regular, moderate 26-28 day menstrual cycles. Natural family planning. Normal Pap and Mammogram history.  Hypertension and anxiety managed by primary care provider. Sleep is better, has not needed to take Melatonin for the past 8 months   Past medical history, past surgical history, family history and social history were all reviewed and documented in the EPIC chart. Works in office setting in patient accounting for [employer], 3 daughters in high school, eldest and middle daughter doing well, youngest daughter "[name]" having trouble with behavior, anxiety and depression. Daughters are receiving therapy.  ROS:  A ROS was performed and pertinent positives and negatives are included.  Exam:  Vitals:   05/16/18 1142  BP: 128/80  Weight: 135 lb (61.2 kg)  Height: 5\' 2"  (1.575 m)   Body mass index is 24.69 kg/m.   General appearance:  Normal Thyroid:  Symmetrical, normal in size, without palpable masses or nodularity. Respiratory  Auscultation:  Clear without wheezing or rhonchi Cardiovascular  Auscultation:  Regular rate, without rubs, murmurs or gallops  Edema/varicosities:  Not grossly evident Abdominal  Soft,nontender, without masses, guarding or rebound.  Liver/spleen:  No organomegaly noted  Hernia:  None appreciated  Skin  Inspection:  Grossly normal   Breasts: Examined lying and sitting.     Right: Without masses, retractions, discharge or axillary adenopathy.     Left: Without masses, retractions, discharge or axillary adenopathy. Gentitourinary   Inguinal/mons:  Normal without inguinal adenopathy  External genitalia:  Normal  BUS/Urethra/Skene's glands:  Normal  Vagina:  Normal  Cervix:  Normal  Uterus: normal in size, shape and contour.  Midline and mobile  Adnexa/parametria:     Rt: Without masses or tenderness.   Lt: Without masses or tenderness.  Anus  and perineum: Normal  Digital rectal exam: Normal sphincter tone without palpated masses or tenderness  Assessment/Plan:  53 y.o. MWF G3P3 for annual exam with complaint of situational stress with youngest daughter.  Monthly cycle/ rhythm method Hypertension, asthma, anxiety - primary care manages labs and meds  Plan: Discussed counseling and therapy for patient and family. Encouraged patient to take care of self, perform leisure activities and have date nights with husband. Pap with HR HPV , SBE's, continue screening mammogram due in March instructed to schedule. Encouraged regular cardio type exercise, low-salt diet, calcium rich diet, over-the-counter vitamin D 1000 daily encouraged, new screening guidelines reviewed.  Screening colonoscopy reviewed and encouraged instructed to schedule [facility].   [place], 12:32 PM

## 2018-05-16 NOTE — Patient Instructions (Signed)
[facility]  Dr Carlean Purl  [phone]  Health Maintenance, Female Adopting a healthy lifestyle and getting preventive care can go a long way to promote health and wellness. Talk with your health care provider about what schedule of regular examinations is right for you. This is a good chance for you to check in with your provider about disease prevention and staying healthy. In between checkups, there are plenty of things you can do on your own. Experts have done a lot of research about which lifestyle changes and preventive measures are most likely to keep you healthy. Ask your health care provider for more information. Weight and diet Eat a healthy diet  Be sure to include plenty of vegetables, fruits, low-fat dairy products, and lean protein.  Do not eat a lot of foods high in solid fats, added sugars, or salt.  Get regular exercise. This is one of the most important things you can do for your health. ? Most adults should exercise for at least 150 minutes each week. The exercise should increase your heart rate and make you sweat (moderate-intensity exercise). ? Most adults should also do strengthening exercises at least twice a week. This is in addition to the moderate-intensity exercise. Maintain a healthy weight  Body mass index (BMI) is a measurement that can be used to identify possible weight problems. It estimates body fat based on height and weight. Your health care provider can help determine your BMI and help you achieve or maintain a healthy weight.  For females 56 years of age and older: ? A BMI below 18.5 is considered underweight. ? A BMI of 18.5 to 24.9 is normal. ? A BMI of 25 to 29.9 is considered overweight. ? A BMI of 30 and above is considered obese. Watch levels of cholesterol and blood lipids  You should start having your blood tested for lipids and cholesterol at 51 years of age, then have this test every 5 years.  You may need to have your cholesterol levels checked  more often if: ? Your lipid or cholesterol levels are high. ? You are older than 51 years of age. ? You are at high risk for heart disease. Cancer screening Lung Cancer  Lung cancer screening is recommended for adults 22-70 years old who are at high risk for lung cancer because of a history of smoking.  A yearly low-dose CT scan of the lungs is recommended for people who: ? Currently smoke. ? Have quit within the past 15 years. ? Have at least a 30-pack-year history of smoking. A pack year is smoking an average of one pack of cigarettes a day for 1 year.  Yearly screening should continue until it has been 15 years since you quit.  Yearly screening should stop if you develop a health problem that would prevent you from having lung cancer treatment. Breast Cancer  Practice breast self-awareness. This means understanding how your breasts normally appear and feel.  It also means doing regular breast self-exams. Let your health care provider know about any changes, no matter how small.  If you are in your 20s or 30s, you should have a clinical breast exam (CBE) by a health care provider every 1-3 years as part of a regular health exam.  If you are 44 or older, have a CBE every year. Also consider having a breast X-ray (mammogram) every year.  If you have a family history of breast cancer, talk to your health care provider about genetic screening.  If you  are at high risk for breast cancer, talk to your health care provider about having an MRI and a mammogram every year.  Breast cancer gene (BRCA) assessment is recommended for women who have family members with BRCA-related cancers. BRCA-related cancers include: ? Breast. ? Ovarian. ? Tubal. ? Peritoneal cancers.  Results of the assessment will determine the need for genetic counseling and BRCA1 and BRCA2 testing. Cervical Cancer Your health care provider may recommend that you be screened regularly for cancer of the pelvic organs  (ovaries, uterus, and vagina). This screening involves a pelvic examination, including checking for microscopic changes to the surface of your cervix (Pap test). You may be encouraged to have this screening done every 3 years, beginning at age 77.  For women ages 79-65, health care providers may recommend pelvic exams and Pap testing every 3 years, or they may recommend the Pap and pelvic exam, combined with testing for human papilloma virus (HPV), every 5 years. Some types of HPV increase your risk of cervical cancer. Testing for HPV may also be done on women of any age with unclear Pap test results.  Other health care providers may not recommend any screening for nonpregnant women who are considered low risk for pelvic cancer and who do not have symptoms. Ask your health care provider if a screening pelvic exam is right for you.  If you have had past treatment for cervical cancer or a condition that could lead to cancer, you need Pap tests and screening for cancer for at least 20 years after your treatment. If Pap tests have been discontinued, your risk factors (such as having a new sexual partner) need to be reassessed to determine if screening should resume. Some women have medical problems that increase the chance of getting cervical cancer. In these cases, your health care provider may recommend more frequent screening and Pap tests. Colorectal Cancer  This type of cancer can be detected and often prevented.  Routine colorectal cancer screening usually begins at 51 years of age and continues through 51 years of age.  Your health care provider may recommend screening at an earlier age if you have risk factors for colon cancer.  Your health care provider may also recommend using home test kits to check for hidden blood in the stool.  A small camera at the end of a tube can be used to examine your colon directly (sigmoidoscopy or colonoscopy). This is done to check for the earliest forms of  colorectal cancer.  Routine screening usually begins at age 66.  Direct examination of the colon should be repeated every 5-10 years through 51 years of age. However, you may need to be screened more often if early forms of precancerous polyps or small growths are found. Skin Cancer  Check your skin from head to toe regularly.  Tell your health care provider about any new moles or changes in moles, especially if there is a change in a mole's shape or color.  Also tell your health care provider if you have a mole that is larger than the size of a pencil eraser.  Always use sunscreen. Apply sunscreen liberally and repeatedly throughout the day.  Protect yourself by wearing long sleeves, pants, a wide-brimmed hat, and sunglasses whenever you are outside. Heart disease, diabetes, and high blood pressure  High blood pressure causes heart disease and increases the risk of stroke. High blood pressure is more likely to develop in: ? People who have blood pressure in the high end of  the normal range (130-139/85-89 mm Hg). ? People who are overweight or obese. ? People who are African American.  If you are 56-68 years of age, have your blood pressure checked every 3-5 years. If you are 49 years of age or older, have your blood pressure checked every year. You should have your blood pressure measured twice-once when you are at a hospital or clinic, and once when you are not at a hospital or clinic. Record the average of the two measurements. To check your blood pressure when you are not at a hospital or clinic, you can use: ? An automated blood pressure machine at a pharmacy. ? A home blood pressure monitor.  If you are between 17 years and 65 years old, ask your health care provider if you should take aspirin to prevent strokes.  Have regular diabetes screenings. This involves taking a blood sample to check your fasting blood sugar level. ? If you are at a normal weight and have a low risk for  diabetes, have this test once every three years after 51 years of age. ? If you are overweight and have a high risk for diabetes, consider being tested at a younger age or more often. Preventing infection Hepatitis B  If you have a higher risk for hepatitis B, you should be screened for this virus. You are considered at high risk for hepatitis B if: ? You were born in a country where hepatitis B is common. Ask your health care provider which countries are considered high risk. ? Your parents were born in a high-risk country, and you have not been immunized against hepatitis B (hepatitis B vaccine). ? You have HIV or AIDS. ? You use needles to inject street drugs. ? You live with someone who has hepatitis B. ? You have had sex with someone who has hepatitis B. ? You get hemodialysis treatment. ? You take certain medicines for conditions, including cancer, organ transplantation, and autoimmune conditions. Hepatitis C  Blood testing is recommended for: ? Everyone born from [birth year] through 1963. ? Anyone with known risk factors for hepatitis C. Sexually transmitted infections (STIs)  You should be screened for sexually transmitted infections (STIs) including gonorrhea and chlamydia if: ? You are sexually active and are younger than 51 years of age. ? You are older than 51 years of age and your health care provider tells you that you are at risk for this type of infection. ? Your sexual activity has changed since you were last screened and you are at an increased risk for chlamydia or gonorrhea. Ask your health care provider if you are at risk.  If you do not have HIV, but are at risk, it may be recommended that you take a prescription medicine daily to prevent HIV infection. This is called pre-exposure prophylaxis (PrEP). You are considered at risk if: ? You are sexually active and do not regularly use condoms or know the HIV status of your partner(s). ? You take drugs by injection. ? You are  sexually active with a partner who has HIV. Talk with your health care provider about whether you are at high risk of being infected with HIV. If you choose to begin PrEP, you should first be tested for HIV. You should then be tested every 3 months for as long as you are taking PrEP. Pregnancy  If you are premenopausal and you may become pregnant, ask your health care provider about preconception counseling.  If you may become pregnant, take 400  to 800 micrograms (mcg) of folic acid every day.  If you want to prevent pregnancy, talk to your health care provider about birth control (contraception). Osteoporosis and menopause  Osteoporosis is a disease in which the bones lose minerals and strength with aging. This can result in serious bone fractures. Your risk for osteoporosis can be identified using a bone density scan.  If you are 26 years of age or older, or if you are at risk for osteoporosis and fractures, ask your health care provider if you should be screened.  Ask your health care provider whether you should take a calcium or vitamin D supplement to lower your risk for osteoporosis.  Menopause may have certain physical symptoms and risks.  Hormone replacement therapy may reduce some of these symptoms and risks. Talk to your health care provider about whether hormone replacement therapy is right for you. Follow these instructions at home:  Schedule regular health, dental, and eye exams.  Stay current with your immunizations.  Do not use any tobacco products including cigarettes, chewing tobacco, or electronic cigarettes.  If you are pregnant, do not drink alcohol.  If you are breastfeeding, limit how much and how often you drink alcohol.  Limit alcohol intake to no more than 1 drink per day for nonpregnant women. One drink equals 12 ounces of beer, 5 ounces of wine, or 1 ounces of hard liquor.  Do not use street drugs.  Do not share needles.  Ask your health care  provider for help if you need support or information about quitting drugs.  Tell your health care provider if you often feel depressed.  Tell your health care provider if you have ever been abused or do not feel safe at home. This information is not intended to replace advice given to you by your health care provider. Make sure you discuss any questions you have with your health care provider. Document Released: 11/10/2010 Document Revised: 10/03/2015 Document Reviewed: 01/29/2015 Elsevier Interactive Patient Education  2019 [employer].

## 2018-05-17 LAB — PAP, TP IMAGING W/ HPV RNA, RFLX HPV TYPE 16,18/45: HPV DNA HIGH RISK: NOT DETECTED

## 2018-05-20 DIAGNOSIS — I119 Hypertensive heart disease without heart failure: Secondary | ICD-10-CM | POA: Diagnosis not present

## 2018-05-20 DIAGNOSIS — E7800 Pure hypercholesterolemia, unspecified: Secondary | ICD-10-CM | POA: Diagnosis not present

## 2018-05-20 DIAGNOSIS — Z6824 Body mass index (BMI) 24.0-24.9, adult: Secondary | ICD-10-CM | POA: Diagnosis not present

## 2018-06-02 MED FILL — BYSTOLIC 5 MG TABLET: 5 | 90 days supply | Qty: 90 | Fill #1

## 2018-06-02 MED FILL — MONTELUKAST SOD 10 MG TAB: 10 | 30 days supply | Qty: 30 | Fill #3

## 2018-07-04 MED FILL — ASMANEX HFA 200 MCG INHALER: 200 | 30 days supply | Qty: 13 | Fill #0

## 2018-07-25 MED FILL — MONTELUKAST SOD 10 MG TAB: 10 | 30 days supply | Qty: 30 | Fill #0

## 2018-08-26 MED FILL — BYSTOLIC 5 MG TABLET: 5 | 90 days supply | Qty: 90 | Fill #0

## 2018-10-07 DIAGNOSIS — D225 Melanocytic nevi of trunk: Secondary | ICD-10-CM | POA: Diagnosis not present

## 2018-10-07 DIAGNOSIS — D2261 Melanocytic nevi of right upper limb, including shoulder: Secondary | ICD-10-CM | POA: Diagnosis not present

## 2018-10-07 DIAGNOSIS — L821 Other seborrheic keratosis: Secondary | ICD-10-CM | POA: Diagnosis not present

## 2018-10-07 DIAGNOSIS — Z86018 Personal history of other benign neoplasm: Secondary | ICD-10-CM | POA: Diagnosis not present

## 2018-10-07 DIAGNOSIS — D2272 Melanocytic nevi of left lower limb, including hip: Secondary | ICD-10-CM | POA: Diagnosis not present

## 2018-11-07 MED FILL — MONTELUKAST SOD 10 MG TAB: 10 | 30 days supply | Qty: 30 | Fill #0

## 2018-11-16 ENCOUNTER — Telehealth: Payer: Self-pay

## 2018-11-16 NOTE — Telephone Encounter (Signed)
Patient said that she finished her period 12 days ago and appears she is starting it again.  She said she is perimenopausal and knows things can be different. She asks is this something she needs to come in and have checked out or is this normal for perimenopause and she should just watch?

## 2018-11-16 NOTE — Telephone Encounter (Signed)
Telephone call, states this is the first time she has had a cycle within 2 weeks of starting last cycle, will watch at this time if irregular cycles with midcycle bleeding persist will proceed with sonohysterogram.  Denies any other symptoms of abdominal pain, vaginal discharge, urinary symptoms.  Will check a home UPT.

## 2018-11-16 NOTE — Telephone Encounter (Signed)
Please call and review it is common to have some irregularities at this time.  We times cycles from day 1 to day 1 and should be at least 21 days.  Looks like we are probably at 18 days?  If they would continue to be closer than 21 days from day 1 to day 1 please have her call me.

## 2018-11-16 NOTE — Telephone Encounter (Signed)
She corrected what she said. She said period STARTED 12 days ago. She said when she expected to be ovulating she started to bleed again. That was why she was concerned.

## 2018-11-17 DIAGNOSIS — E559 Vitamin D deficiency, unspecified: Secondary | ICD-10-CM | POA: Diagnosis not present

## 2018-11-17 DIAGNOSIS — E7800 Pure hypercholesterolemia, unspecified: Secondary | ICD-10-CM | POA: Diagnosis not present

## 2018-11-17 DIAGNOSIS — Z0000 Encounter for general adult medical examination without abnormal findings: Secondary | ICD-10-CM | POA: Diagnosis not present

## 2018-11-17 DIAGNOSIS — I119 Hypertensive heart disease without heart failure: Secondary | ICD-10-CM | POA: Diagnosis not present

## 2018-11-17 DIAGNOSIS — Z6824 Body mass index (BMI) 24.0-24.9, adult: Secondary | ICD-10-CM | POA: Diagnosis not present

## 2018-11-25 MED FILL — BYSTOLIC 5 MG TABLET: 5 | 90 days supply | Qty: 90 | Fill #0

## 2018-12-15 DIAGNOSIS — H524 Presbyopia: Secondary | ICD-10-CM | POA: Diagnosis not present

## 2019-02-17 DIAGNOSIS — J4530 Mild persistent asthma, uncomplicated: Secondary | ICD-10-CM | POA: Diagnosis not present

## 2019-02-17 DIAGNOSIS — I119 Hypertensive heart disease without heart failure: Secondary | ICD-10-CM | POA: Diagnosis not present

## 2019-02-17 DIAGNOSIS — E559 Vitamin D deficiency, unspecified: Secondary | ICD-10-CM | POA: Diagnosis not present

## 2019-02-17 DIAGNOSIS — Z23 Encounter for immunization: Secondary | ICD-10-CM | POA: Diagnosis not present

## 2019-02-22 ENCOUNTER — Other Ambulatory Visit: Payer: Self-pay | Admitting: Cardiology

## 2019-02-22 DIAGNOSIS — I10 Essential (primary) hypertension: Secondary | ICD-10-CM

## 2019-02-22 MED FILL — BYSTOLIC 5 MG TABLET: 5 | 30 days supply | Qty: 30 | Fill #0

## 2019-02-22 MED FILL — ALBUTEROL SULFATE HFA 108 (: 108 (90 BAS | 25 days supply | Qty: 9 | Fill #0

## 2019-03-24 ENCOUNTER — Telehealth: Payer: Self-pay | Admitting: Cardiology

## 2019-03-24 ENCOUNTER — Other Ambulatory Visit: Payer: Self-pay | Admitting: Cardiology

## 2019-03-24 DIAGNOSIS — I10 Essential (primary) hypertension: Secondary | ICD-10-CM

## 2019-03-24 NOTE — Telephone Encounter (Signed)
°*  STAT* If patient is at the pharmacy, call can be transferred to refill team.   1. Which medications need to be refilled? (please list name of each medication and dose if known) BYSTOLIC 5 MG tablet  2. Which pharmacy/location (including street and city if local pharmacy) is medication to be sent to? [place], [place].  3. Do they need a 30 day or 90 day supply? 90 day  Patient only has 3 pills left

## 2019-03-25 NOTE — Progress Notes (Signed)
Cardiology Office Note   Date:  03/27/2019   ID:  Soren Langworthy, DOB 07-May-1966, MRN [id number]  PCP:  Jilda Panda, MD  Cardiologist:    [place], MD   Chief Complaint  Patient presents with  . Palpitations      History of Present Illness: Faith Miller is a 53 y.o. female who is seen for follow up  of HTN and palpitations. She has a history of asthma, HTN and HLD. She was seen initially in January 2014 for evaluation of palpitations. Event monitor was normal. Echo done in 2013 showed mild mitral regurgitation, otherwise normal.  She was taking a diuretic for BP but this made her feel fatigued- like wearing a lead suit. She was having more palpitations. We switched her to Bystolic and she felt much better. Palpitations were well controlled.   Positive family history with father dying of MI in early 53s. She had a normal stress Echo in 2018.   On follow up today she notes she has been under a lot of stress. Her BP has been running higher. She feels her pulse beating in her right ear. Rare palpitations related to menstrual cycle. Perimenopausal.     Past Medical History:  Diagnosis Date  . Asthma   . History of elevated lipids   . Hypertension   . IC (interstitial cystitis)     Past Surgical History:  Procedure Laterality Date  . GALLBLADDER SURGERY    . WISDOM TOOTH EXTRACTION       Current Outpatient Medications  Medication Sig Dispense Refill  . albuterol (PROVENTIL HFA;VENTOLIN HFA) 108 (90 Base) MCG/ACT inhaler Inhale 2 puffs into the lungs every 6 (six) hours as needed for wheezing or shortness of breath.    Marland Kitchen ibuprofen (ADVIL,MOTRIN) 200 MG tablet Take 200 mg by mouth every 6 (six) hours as needed for moderate pain.    . mometasone (ASMANEX) 220 MCG/INH inhaler Inhale 2 puffs into the lungs daily as needed (shortness of breath).     . montelukast (SINGULAIR) 10 MG tablet Take 10 mg by mouth as needed.     . nebivolol (BYSTOLIC) 10 MG tablet Take 1  tablet (10 mg total) by mouth daily. 90 tablet 3   No current facility-administered medications for this visit.     Allergies:   Aspirin    Social History:  The patient  reports that she has never smoked. She has never used smokeless tobacco. She reports current alcohol use. She reports that she does not use drugs.   Family History:  The patient's family history includes Breast cancer in her maternal grandmother; Cancer in her paternal grandmother; Heart disease in her father and mother; Hypertension in her father and mother.    ROS:  Please see the history of present illness.   Otherwise, review of systems are positive for none.   All other systems are reviewed and negative.    PHYSICAL EXAM: VS:  BP 140/80   Pulse 64   Ht 5\' 2"  (1.575 m)   Wt 141 lb 3.2 oz (64 kg)   SpO2 99%   BMI 25.83 kg/m  , BMI Body mass index is 25.83 kg/m. GENERAL:  Well appearing WF in NAD HEENT:  PERRL, EOMI, sclera are clear. Oropharynx is clear. NECK:  No jugular venous distention, carotid upstroke brisk and symmetric, no bruits, no thyromegaly or adenopathy LUNGS:  Clear to auscultation bilaterally CHEST:  Unremarkable HEART:  RRR,  PMI not displaced or sustained,S1 and S2 within normal  limits, no S3, no S4: no clicks, no rubs, no murmurs ABD:  Soft, nontender. BS +, no masses or bruits. No hepatomegaly, no splenomegaly EXT:  2 + pulses throughout, no edema, no cyanosis no clubbing SKIN:  Warm and dry.  No rashes NEURO:  Alert and oriented x 3. Cranial nerves II through XII intact. PSYCH:  Cognitively intact   EKG:  EKG is not  ordered today.    Recent Labs: No results found for requested labs within last 8760 hours.    Lipid Panel No results found for: CHOL, TRIG, HDL, CHOLHDL, VLDL, LDLCALC, LDLDIRECT    Wt Readings from Last 3 Encounters:  03/27/19 141 lb 3.2 oz (64 kg)  05/16/18 135 lb (61.2 kg)  01/07/18 129 lb 8 oz (58.7 kg)      Other studies Reviewed: Additional studies/  records that were reviewed today include:   Stress Echo 05/01/17: Study Conclusions  - Stress ECG conclusions: The stress ECG was normal. - Staged echo: Normal echo stress  Impressions:  - Normal study after maximal exercise.   ASSESSMENT AND PLAN:  1.  Palpitations. Chronic and much improved on Bystolic.  Prior evaluation in 2014 unremarkable. Continue beta blocker therapy.  2. HTN. BP is running higher. Will increase Bystolic to 10 mg daily. I have requested a copy of lab work from primary care.    Follow up one year  Signed,  [place], MD  03/27/2019 8:28 AM    [facility] [address] [place], [place], [address] Phone [phone], Fax [phone]

## 2019-03-27 ENCOUNTER — Ambulatory Visit (INDEPENDENT_AMBULATORY_CARE_PROVIDER_SITE_OTHER): Payer: 59 | Admitting: Cardiology

## 2019-03-27 ENCOUNTER — Other Ambulatory Visit: Payer: Self-pay

## 2019-03-27 ENCOUNTER — Encounter: Payer: Self-pay | Admitting: Cardiology

## 2019-03-27 VITALS — BP 140/80 | HR 64 | Ht 62.0 in | Wt 141.2 lb

## 2019-03-27 DIAGNOSIS — R002 Palpitations: Secondary | ICD-10-CM | POA: Diagnosis not present

## 2019-03-27 DIAGNOSIS — I10 Essential (primary) hypertension: Secondary | ICD-10-CM | POA: Diagnosis not present

## 2019-03-27 MED ORDER — NEBIVOLOL HCL 10 MG PO TABS: 10.0000 mg | ORAL_TABLET | Freq: Every day | ORAL | 3 refills | Status: DC

## 2019-03-27 MED FILL — BYSTOLIC 10 MG TABLET: 10 | 90 days supply | Qty: 90 | Fill #0

## 2019-03-27 NOTE — Patient Instructions (Signed)
Increase Bystolic to 10 mg daily  We will request a copy of your labs from Dr Mellody Drown.   Follow up in one year.

## 2019-03-31 ENCOUNTER — Other Ambulatory Visit: Payer: Self-pay | Admitting: Women's Health

## 2019-03-31 DIAGNOSIS — Z1231 Encounter for screening mammogram for malignant neoplasm of breast: Secondary | ICD-10-CM

## 2019-04-13 DIAGNOSIS — H6122 Impacted cerumen, left ear: Secondary | ICD-10-CM | POA: Diagnosis not present

## 2019-04-13 DIAGNOSIS — Z7289 Other problems related to lifestyle: Secondary | ICD-10-CM | POA: Diagnosis not present

## 2019-04-13 DIAGNOSIS — H93A1 Pulsatile tinnitus, right ear: Secondary | ICD-10-CM | POA: Diagnosis not present

## 2019-04-13 DIAGNOSIS — J302 Other seasonal allergic rhinitis: Secondary | ICD-10-CM | POA: Diagnosis not present

## 2019-04-13 DIAGNOSIS — I10 Essential (primary) hypertension: Secondary | ICD-10-CM | POA: Diagnosis not present

## 2019-04-13 DIAGNOSIS — H9313 Tinnitus, bilateral: Secondary | ICD-10-CM | POA: Diagnosis not present

## 2019-04-17 ENCOUNTER — Other Ambulatory Visit (HOSPITAL_COMMUNITY): Payer: Self-pay | Admitting: Otolaryngology

## 2019-04-17 ENCOUNTER — Other Ambulatory Visit: Payer: Self-pay | Admitting: Otolaryngology

## 2019-04-24 ENCOUNTER — Other Ambulatory Visit: Payer: Self-pay | Admitting: Otolaryngology

## 2019-04-24 ENCOUNTER — Telehealth: Payer: Self-pay | Admitting: Cardiology

## 2019-04-24 DIAGNOSIS — H93A1 Pulsatile tinnitus, right ear: Secondary | ICD-10-CM

## 2019-04-24 NOTE — Telephone Encounter (Signed)
Pt c/o medication issue:  1. Name of Medication: Bystolic  2. How are you currently taking this medication (dosage and times per day)?  1 time a day  3. Are you having a reaction (difficulty breathing--STAT)?   no  4. What is your medication issue? Real fatigued, and lightheaded- wonder if she decrease her Bystolic

## 2019-04-24 NOTE — Telephone Encounter (Signed)
BP looks ok so we can try and reduce Bystolic to 5 mg daily.   Deneene Tarver Martinique MD, [facility]

## 2019-04-24 NOTE — Telephone Encounter (Signed)
Spoke to patient [name]'s advice given.Advised to call back if she continues to have fatigue.

## 2019-04-24 NOTE — Telephone Encounter (Signed)
Spoke with pt and notes the fatigue once again gradually increasing in intensity  Similar when was taking Metoprolol Per pt has noted this  for approx 2.5 -3 weeks Per pt B/P has a big range 107-133/79-81 Pt wanting to know if may decrease Bystolic and see if makes a difference Per pt HR is good Will forward to Dr Martinique for review and recommendations ./cy

## 2019-04-26 ENCOUNTER — Ambulatory Visit
Admission: RE | Admit: 2019-04-26 | Discharge: 2019-04-26 | Disposition: A | Discharge status: Home / Self Care | Payer: 59 | Source: Ambulatory Visit | Attending: Otolaryngology | Admitting: Otolaryngology

## 2019-04-26 DIAGNOSIS — I672 Cerebral atherosclerosis: Secondary | ICD-10-CM | POA: Diagnosis not present

## 2019-04-26 DIAGNOSIS — H93A1 Pulsatile tinnitus, right ear: Secondary | ICD-10-CM

## 2019-04-26 MED ORDER — IOPAMIDOL (ISOVUE-370) INJECTION 76%
75.0000 mL | Freq: Once | INTRAVENOUS | Status: AC | PRN
  Administered 2019-04-26: 75 mL via INTRAVENOUS

## 2019-05-19 ENCOUNTER — Other Ambulatory Visit: Payer: Self-pay

## 2019-05-22 ENCOUNTER — Ambulatory Visit (INDEPENDENT_AMBULATORY_CARE_PROVIDER_SITE_OTHER): Payer: 59 | Admitting: Women's Health

## 2019-05-22 ENCOUNTER — Other Ambulatory Visit: Payer: Self-pay

## 2019-05-22 ENCOUNTER — Encounter: Payer: Self-pay | Admitting: Women's Health

## 2019-05-22 VITALS — BP 124/78 | Ht 62.0 in | Wt 141.0 lb

## 2019-05-22 DIAGNOSIS — Z01419 Encounter for gynecological examination (general) (routine) without abnormal findings: Secondary | ICD-10-CM | POA: Diagnosis not present

## 2019-05-22 NOTE — Progress Notes (Signed)
Faith Miller [birth year] [id number]    History: 54 yo G3P3 MWF Presents for annual exam.  Monthly cycles/natural family planning. Normal pap and mammogram history. Reports non-menstrual cramps and bloating after eating. 6 lb weight gain since last visit. Some dyspareunia, using lubrication.  Medical problems include HTN, depression primary care managing. 2015 3 polyps on colonoscopy 5 year F/U, due. Denies hot flashes, urinary symptoms, spotting.   Past medical history, past surgical history, family history and social history were all reviewed and documented in the EPIC chart. Maternal grandmother deceased from colon cancer. Works at [employer]. 3 daughters, problems with youngest's behavior, anxiety, depression, psychosis and receiving therapy.parents hypertension, father deceased heart disease.  ROS:  A ROS was performed and pertinent positives and negatives are included.  Exam:  Vitals:   05/22/19 0852  BP: 124/78  Weight: 141 lb (64 kg)  Height: 5\' 2"  (1.575 m)   Body mass index is 25.79 kg/m.   General appearance:  Normal Thyroid:  Symmetrical, normal in size, without palpable masses or nodularity. Respiratory  Auscultation:  Clear without wheezing or rhonchi Cardiovascular  Auscultation:  Regular rate, without rubs, murmurs or gallops  Edema/varicosities:  Not grossly evident Abdominal  Soft,nontender, without masses, guarding or rebound.  Liver/spleen:  No organomegaly noted  Hernia:  None appreciated  Skin  Inspection:  Grossly normal   Breasts: Examined lying and sitting.     Right: Without masses, retractions, discharge or axillary adenopathy.    Left: Without masses, retractions, discharge or axillary adenopathy. Gentitourinary   Inguinal/mons:  Normal without inguinal adenopathy  External genitalia:  Normal  BUS/Urethra/Skene's glands:  Normal  Vagina:  Normal, leukorrhea visualized,   Cervix:  Normal  Uterus:  normal in size, shape and contour.  Midline and  mobile  Adnexa/parametria:     Rt: Without masses or tenderness.   Lt: Without masses or tenderness.  Anus and perineum: Normal  Digital rectal exam: Normal sphincter tone without palpated masses or tenderness  Assessment/Plan:  54 y.o. G3P3 MWF presents for annual exam with occassional Gi symptoms.   Monthly cycle/rhythm method/mild vaginal dryness Hypertension, anxiety, asthma- primary care manages meds and labs  Situational stress youngest daughter Faith Miller age 21 mental health problems (A/D, psychosis, gender confusion)  Plan: Normal pap with HR HPV 05/2018, SBE's, continue with scheduled mammogram 05/25/18. Encouraged therapy for patient and family. Encouraged finding leisure activity, self-care, and date nights with husband. Recommended cardio exercise and vitamin D 2000 daily. Screening colonoscopy reviewed and instructed to schedule. If abdominal cramping continues after colonoscopy, instructed to schedule ultrasound. Reviewed intermittent discharge normal and continue vag lubricant as needed.   [place], 8:58 AM 05/22/2019

## 2019-05-22 NOTE — Patient Instructions (Addendum)
It was good to see you today Vitamin D 2000 IUs daily Colonoscopy Dr Susette Racer to schedule  Health Maintenance, Female Adopting a healthy lifestyle and getting preventive care are important in promoting health and wellness. Ask your health care provider about:  The right schedule for you to have regular tests and exams.  Things you can do on your own to prevent diseases and keep yourself healthy. What should I know about diet, weight, and exercise? Eat a healthy diet   Eat a diet that includes plenty of vegetables, fruits, low-fat dairy products, and lean protein.  Do not eat a lot of foods that are high in solid fats, added sugars, or sodium. Maintain a healthy weight Body mass index (BMI) is used to identify weight problems. It estimates body fat based on height and weight. Your health care provider can help determine your BMI and help you achieve or maintain a healthy weight. Get regular exercise Get regular exercise. This is one of the most important things you can do for your health. Most adults should:  Exercise for at least 150 minutes each week. The exercise should increase your heart rate and make you sweat (moderate-intensity exercise).  Do strengthening exercises at least twice a week. This is in addition to the moderate-intensity exercise.  Spend less time sitting. Even light physical activity can be beneficial. Watch cholesterol and blood lipids Have your blood tested for lipids and cholesterol at 52 years of age, then have this test every 5 years. Have your cholesterol levels checked more often if:  Your lipid or cholesterol levels are high.  You are older than 52 years of age.  You are at high risk for heart disease. What should I know about cancer screening? Depending on your health history and family history, you may need to have cancer screening at various ages. This may include screening for:  Breast cancer.  Cervical cancer.  Colorectal cancer.  Skin  cancer.  Lung cancer. What should I know about heart disease, diabetes, and high blood pressure? Blood pressure and heart disease  High blood pressure causes heart disease and increases the risk of stroke. This is more likely to develop in people who have high blood pressure readings, are of African descent, or are overweight.  Have your blood pressure checked: ? Every 3-5 years if you are 38-80 years of age. ? Every year if you are 55 years old or older. Diabetes Have regular diabetes screenings. This checks your fasting blood sugar level. Have the screening done:  Once every three years after age 39 if you are at a normal weight and have a low risk for diabetes.  More often and at a younger age if you are overweight or have a high risk for diabetes. What should I know about preventing infection? Hepatitis B If you have a higher risk for hepatitis B, you should be screened for this virus. Talk with your health care provider to find out if you are at risk for hepatitis B infection. Hepatitis C Testing is recommended for:  Everyone born from [birth year] through 1965.  Anyone with known risk factors for hepatitis C. Sexually transmitted infections (STIs)  Get screened for STIs, including gonorrhea and chlamydia, if: ? You are sexually active and are younger than 52 years of age. ? You are older than 51 years of age and your health care provider tells you that you are at risk for this type of infection. ? Your sexual activity has changed since you were  last screened, and you are at increased risk for chlamydia or gonorrhea. Ask your health care provider if you are at risk.  Ask your health care provider about whether you are at high risk for HIV. Your health care provider may recommend a prescription medicine to help prevent HIV infection. If you choose to take medicine to prevent HIV, you should first get tested for HIV. You should then be tested every 3 months for as long as you are taking  the medicine. Pregnancy  If you are about to stop having your period (premenopausal) and you may become pregnant, seek counseling before you get pregnant.  Take 400 to 800 micrograms (mcg) of folic acid every day if you become pregnant.  Ask for birth control (contraception) if you want to prevent pregnancy. Osteoporosis and menopause Osteoporosis is a disease in which the bones lose minerals and strength with aging. This can result in bone fractures. If you are 62 years old or older, or if you are at risk for osteoporosis and fractures, ask your health care provider if you should:  Be screened for bone loss.  Take a calcium or vitamin D supplement to lower your risk of fractures.  Be given hormone replacement therapy (HRT) to treat symptoms of menopause. Follow these instructions at home: Lifestyle  Do not use any products that contain nicotine or tobacco, such as cigarettes, e-cigarettes, and chewing tobacco. If you need help quitting, ask your health care provider.  Do not use street drugs.  Do not share needles.  Ask your health care provider for help if you need support or information about quitting drugs. Alcohol use  Do not drink alcohol if: ? Your health care provider tells you not to drink. ? You are pregnant, may be pregnant, or are planning to become pregnant.  If you drink alcohol: ? Limit how much you use to 0-1 drink a day. ? Limit intake if you are breastfeeding.  Be aware of how much alcohol is in your drink. In the [place] one drink equals one 12 oz bottle of beer (355 mL), one 5 oz glass of wine (148 mL), or one 1 oz glass of hard liquor (44 mL). General instructions  Schedule regular health, dental, and eye exams.  Stay current with your vaccines.  Tell your health care provider if: ? You often feel depressed. ? You have ever been abused or do not feel safe at home. Summary  Adopting a healthy lifestyle and getting preventive care are important in  promoting health and wellness.  Follow your health care provider's instructions about healthy diet, exercising, and getting tested or screened for diseases.  Follow your health care provider's instructions on monitoring your cholesterol and blood pressure. This information is not intended to replace advice given to you by your health care provider. Make sure you discuss any questions you have with your health care provider. Document Revised: 04/20/2018 Document Reviewed: 04/20/2018 Elsevier Patient Education  2020 [employer].

## 2019-05-23 LAB — URINALYSIS, COMPLETE W/RFL CULTURE
Bacteria, UA: NONE SEEN /HPF
Bilirubin Urine: NEGATIVE
Glucose, UA: NEGATIVE
Hgb urine dipstick: NEGATIVE
Hyaline Cast: NONE SEEN /LPF
Leukocyte Esterase: NEGATIVE
Nitrites, Initial: NEGATIVE
Protein, ur: NEGATIVE
RBC / HPF: NONE SEEN /HPF (ref 0–2)
Specific Gravity, Urine: 1.027 (ref 1.001–1.03)
Squamous Epithelial / LPF: NONE SEEN /HPF (ref <=5)
pH: <=5 (ref 5.0–8.0)

## 2019-05-26 ENCOUNTER — Other Ambulatory Visit: Payer: Self-pay

## 2019-05-26 ENCOUNTER — Ambulatory Visit
Admission: RE | Admit: 2019-05-26 | Discharge: 2019-05-26 | Disposition: A | Discharge status: Home / Self Care | Payer: 59 | Source: Ambulatory Visit | Attending: Women's Health | Admitting: Women's Health

## 2019-05-26 DIAGNOSIS — Z1231 Encounter for screening mammogram for malignant neoplasm of breast: Secondary | ICD-10-CM

## 2019-05-29 MED FILL — MONTELUKAST SOD 10 MG TAB: 10 | 30 days supply | Qty: 30 | Fill #1

## 2019-06-16 DIAGNOSIS — I119 Hypertensive heart disease without heart failure: Secondary | ICD-10-CM | POA: Diagnosis not present

## 2019-06-16 DIAGNOSIS — E559 Vitamin D deficiency, unspecified: Secondary | ICD-10-CM | POA: Diagnosis not present

## 2019-07-06 MED FILL — ALBUTEROL SULFATE HFA 108 (: 108 (90 BAS | 25 days supply | Qty: 9 | Fill #1

## 2019-08-16 ENCOUNTER — Other Ambulatory Visit (HOSPITAL_COMMUNITY): Payer: Self-pay | Admitting: Internal Medicine

## 2019-08-16 MED FILL — MONTELUKAST SOD 10 MG TAB: 10 | 30 days supply | Qty: 30 | Fill #0

## 2019-08-16 MED FILL — BYSTOLIC 10 MG TABLET: 10 | 90 days supply | Qty: 90 | Fill #1

## 2019-10-10 MED FILL — MONTELUKAST SOD 10 MG TAB: 10 | 30 days supply | Qty: 30 | Fill #1

## 2019-11-15 MED FILL — BYSTOLIC 10 MG TABLET: 10 | 90 days supply | Qty: 90 | Fill #2

## 2019-11-15 MED FILL — MONTELUKAST SOD 10 MG TAB: 10 | 30 days supply | Qty: 30 | Fill #2

## 2019-12-09 ENCOUNTER — Ambulatory Visit: Payer: Self-pay

## 2019-12-09 ENCOUNTER — Ambulatory Visit: Payer: Self-pay | Attending: Internal Medicine

## 2019-12-09 DIAGNOSIS — Z23 Encounter for immunization: Secondary | ICD-10-CM

## 2019-12-09 NOTE — Progress Notes (Signed)
   Covid-19 Vaccination Clinic  Name:  Elliett Guarisco    MRN: [id number] DOB: Aug 03, 1965  12/09/2019  Ms. Lisby was observed post Covid-19 immunization for 15 minutes without incident. She was provided with Vaccine Information Sheet and instruction to access the V-Safe system.   Ms. Bruney was instructed to call 911 with any severe reactions post vaccine: Marland Kitchen Difficulty breathing  . Swelling of face and throat  . A fast heartbeat  . A bad rash all over body  . Dizziness and weakness   Immunizations Administered    Name Date Dose VIS Date Route   Pfizer COVID-19 Vaccine 12/09/2019  8:19 AM 0.3 mL 07/05/2018 Intramuscular   Manufacturer: [employer]   Lot: [id number]   [place]: [id number]

## 2020-01-02 ENCOUNTER — Ambulatory Visit: Payer: Self-pay | Attending: Internal Medicine

## 2020-01-02 DIAGNOSIS — Z23 Encounter for immunization: Secondary | ICD-10-CM

## 2020-01-02 NOTE — Progress Notes (Signed)
   Covid-19 Vaccination Clinic  Name:  Faith Miller    MRN: [id number] DOB: [birth year]  01/02/2020  [name] was observed post Covid-19 immunization for 30 minutes based on pre-vaccination screening without incident. She was provided with Vaccine Information Sheet and instruction to access the V-Safe system.   [name] was instructed to call 911 with any severe reactions post vaccine: Marland Kitchen Difficulty breathing  . Swelling of face and throat  . A fast heartbeat  . A bad rash all over body  . Dizziness and weakness   Immunizations Administered    Name Date Dose VIS Date Route   Pfizer COVID-19 Vaccine 01/02/2020  9:29 AM 0.3 mL 07/05/2018 Intramuscular   Manufacturer: [place]   Lot: [id number]   [place]: [id number]

## 2020-01-22 MED FILL — MONTELUKAST SOD 10 MG TAB: 10 | 30 days supply | Qty: 30 | Fill #3

## 2020-05-22 ENCOUNTER — Encounter: Payer: 59 | Admitting: Nurse Practitioner

## 2020-06-13 ENCOUNTER — Ambulatory Visit: Payer: PRIVATE HEALTH INSURANCE | Admitting: Nurse Practitioner

## 2020-06-13 ENCOUNTER — Encounter: Payer: Self-pay | Admitting: Nurse Practitioner

## 2020-06-13 ENCOUNTER — Other Ambulatory Visit: Payer: Self-pay

## 2020-06-13 VITALS — BP 122/84 | HR 72 | Resp 18 | Ht 62.25 in | Wt 142.8 lb

## 2020-06-13 DIAGNOSIS — Z01419 Encounter for gynecological examination (general) (routine) without abnormal findings: Secondary | ICD-10-CM

## 2020-06-13 NOTE — Progress Notes (Signed)
   Faith Miller 03/11/1966 [id number]   History:  55 y.o. G3P3 presents for annual exam. Most regular monthly cycles/natural family planning. She has noticed some vaginal dryness that is causing painful intercourse. She has had improvement with lubrication. Normal pap and mammogram history. HTN managed by PCP.   Gynecologic History Patient's last menstrual period was 05/23/2020 (exact date). Period Cycle (Days): 27 Period Duration (Days): 10 Period Pattern: Regular Menstrual Flow: Moderate Menstrual Control: Thin pad Menstrual Control Change Freq (Hours): 4 Dysmenorrhea: (!) Mild Dysmenorrhea Symptoms: Nausea,Cramping,Headache Contraception/Family planning: rhythm method  Health Maintenance Last Pap: 05/16/2018. Results were: normal Last mammogram: 05/2019. Results were: normal Last colonoscopy: 2015. Results were: polyp x 3. Scheduled in March Last Dexa: N/A  Past medical history, past surgical history, family history and social history were all reviewed and documented in the EPIC chart.  ROS:  A ROS was performed and pertinent positives and negatives are included.  Exam:  Vitals:   06/13/20 0831  BP: 122/84  Pulse: 72  Resp: 18  Weight: 142 lb 12.8 oz (64.8 kg)  Height: 5' 2.25" (1.581 m)   Body mass index is 25.91 kg/m.  General appearance:  Normal Thyroid:  Symmetrical, normal in size, without palpable masses or nodularity. Respiratory  Auscultation:  Clear without wheezing or rhonchi Cardiovascular  Auscultation:  Regular rate, without rubs, murmurs or gallops  Edema/varicosities:  Not grossly evident Abdominal  Soft,nontender, without masses, guarding or rebound.  Liver/spleen:  No organomegaly noted  Hernia:  None appreciated  Skin  Inspection:  Grossly normal   Breasts: Examined lying and sitting.   Right: Without masses, retractions, discharge or axillary adenopathy.   Left: Without masses, retractions, discharge or axillary  adenopathy. Gentitourinary   Inguinal/mons:  Normal without inguinal adenopathy  External genitalia:  Normal  BUS/Urethra/Skene's glands:  Normal  Vagina:  Normal  Cervix:  Normal  Uterus:  Normal in size, shape and contour.  Midline and mobile  Adnexa/parametria:     Rt: Without masses or tenderness.   Lt: Without masses or tenderness.  Anus and perineum: Normal  Digital rectal exam: Normal sphincter tone without palpated masses or tenderness  Assessment/Plan:  55 y.o. G3P3 for annual exam.   Well female exam with routine gynecological exam - Education provided on SBEs, importance of preventative screenings, current guidelines, high calcium diet, regular exercise, and multivitamin daily. Labs with PCP.   Screening for cervical cancer - Normal Pap history.  Will repeat at 5-year interval per guidelines.  Screening for breast cancer - Normal mammogram history.  Continue annual screenings.  Normal breast exam today.  Screening for colon cancer - 2015 colonoscopy. Sees GI in March.  Follow up in 1 year for annual.     Tamela Gammon [facility], 8:51 AM 06/13/2020

## 2020-06-13 NOTE — Patient Instructions (Signed)
Health Maintenance, Female Adopting a healthy lifestyle and getting preventive care are important in promoting health and wellness. Ask your health care provider about:  The right schedule for you to have regular tests and exams.  Things you can do on your own to prevent diseases and keep yourself healthy. What should I know about diet, weight, and exercise? Eat a healthy diet  Eat a diet that includes plenty of vegetables, fruits, low-fat dairy products, and lean protein.  Do not eat a lot of foods that are high in solid fats, added sugars, or sodium.   Maintain a healthy weight Body mass index (BMI) is used to identify weight problems. It estimates body fat based on height and weight. Your health care provider can help determine your BMI and help you achieve or maintain a healthy weight. Get regular exercise Get regular exercise. This is one of the most important things you can do for your health. Most adults should:  Exercise for at least 150 minutes each week. The exercise should increase your heart rate and make you sweat (moderate-intensity exercise).  Do strengthening exercises at least twice a week. This is in addition to the moderate-intensity exercise.  Spend less time sitting. Even light physical activity can be beneficial. Watch cholesterol and blood lipids Have your blood tested for lipids and cholesterol at 53 years of age, then have this test every 5 years. Have your cholesterol levels checked more often if:  Your lipid or cholesterol levels are high.  You are older than 53 years of age.  You are at high risk for heart disease. What should I know about cancer screening? Depending on your health history and family history, you may need to have cancer screening at various ages. This may include screening for:  Breast cancer.  Cervical cancer.  Colorectal cancer.  Skin cancer.  Lung cancer. What should I know about heart disease, diabetes, and high blood  pressure? Blood pressure and heart disease  High blood pressure causes heart disease and increases the risk of stroke. This is more likely to develop in people who have high blood pressure readings, are of African descent, or are overweight.  Have your blood pressure checked: ? Every 3-5 years if you are 18-39 years of age. ? Every year if you are 40 years old or older. Diabetes Have regular diabetes screenings. This checks your fasting blood sugar level. Have the screening done:  Once every three years after age 40 if you are at a normal weight and have a low risk for diabetes.  More often and at a younger age if you are overweight or have a high risk for diabetes. What should I know about preventing infection? Hepatitis B If you have a higher risk for hepatitis B, you should be screened for this virus. Talk with your health care provider to find out if you are at risk for hepatitis B infection. Hepatitis C Testing is recommended for:  Everyone born from 1945 through 1965.  Anyone with known risk factors for hepatitis C. Sexually transmitted infections (STIs)  Get screened for STIs, including gonorrhea and chlamydia, if: ? You are sexually active and are younger than 53 years of age. ? You are older than 53 years of age and your health care provider tells you that you are at risk for this type of infection. ? Your sexual activity has changed since you were last screened, and you are at increased risk for chlamydia or gonorrhea. Ask your health care provider   if you are at risk.  Ask your health care provider about whether you are at high risk for HIV. Your health care provider may recommend a prescription medicine to help prevent HIV infection. If you choose to take medicine to prevent HIV, you should first get tested for HIV. You should then be tested every 3 months for as long as you are taking the medicine. Pregnancy  If you are about to stop having your period (premenopausal) and  you may become pregnant, seek counseling before you get pregnant.  Take 400 to 800 micrograms (mcg) of folic acid every day if you become pregnant.  Ask for birth control (contraception) if you want to prevent pregnancy. Osteoporosis and menopause Osteoporosis is a disease in which the bones lose minerals and strength with aging. This can result in bone fractures. If you are 65 years old or older, or if you are at risk for osteoporosis and fractures, ask your health care provider if you should:  Be screened for bone loss.  Take a calcium or vitamin D supplement to lower your risk of fractures.  Be given hormone replacement therapy (HRT) to treat symptoms of menopause. Follow these instructions at home: Lifestyle  Do not use any products that contain nicotine or tobacco, such as cigarettes, e-cigarettes, and chewing tobacco. If you need help quitting, ask your health care provider.  Do not use street drugs.  Do not share needles.  Ask your health care provider for help if you need support or information about quitting drugs. Alcohol use  Do not drink alcohol if: ? Your health care provider tells you not to drink. ? You are pregnant, may be pregnant, or are planning to become pregnant.  If you drink alcohol: ? Limit how much you use to 0-1 drink a day. ? Limit intake if you are breastfeeding.  Be aware of how much alcohol is in your drink. In the [place] one drink equals one 12 oz bottle of beer (355 mL), one 5 oz glass of wine (148 mL), or one 1 oz glass of hard liquor (44 mL). General instructions  Schedule regular health, dental, and eye exams.  Stay current with your vaccines.  Tell your health care provider if: ? You often feel depressed. ? You have ever been abused or do not feel safe at home. Summary  Adopting a healthy lifestyle and getting preventive care are important in promoting health and wellness.  Follow your health care provider's instructions about healthy  diet, exercising, and getting tested or screened for diseases.  Follow your health care provider's instructions on monitoring your cholesterol and blood pressure. This information is not intended to replace advice given to you by your health care provider. Make sure you discuss any questions you have with your health care provider. Document Revised: 04/20/2018 Document Reviewed: 04/20/2018 Elsevier Patient Education  2021 Elsevier Inc.  

## 2020-07-17 MED FILL — MONTELUKAST SOD 10 MG TAB: 10 | 30 days supply | Qty: 30 | Fill #4

## 2020-07-22 NOTE — Progress Notes (Unsigned)
Cardiology Office Note   Date:  07/26/2020   ID:  Faith Miller, DOB 04/17/1966, MRN [id number]  PCP:  Jilda Panda, MD  Cardiologist:   Peter Martinique, MD   Chief Complaint  Patient presents with  . Hypertension      History of Present Illness: Faith Miller is a 55 y.o. female who is seen for follow up  of HTN and palpitations. She has a history of asthma, HTN and HLD. She was seen initially in January 2014 for evaluation of palpitations. Event monitor was normal. Echo done in 2013 showed mild mitral regurgitation, otherwise normal.  She was taking a diuretic for BP but this made her feel fatigued- like wearing a lead suit. She was having more palpitations. We switched her to Bystolic and she felt much better. Palpitations were well controlled.   Positive family history with father dying of MI in early 44s. She had a normal stress Echo in 2018.   On follow up today she notes she is feeling well.  BP fluctuates- may range from 116-151 in one day. Her husband has been on a Keto diet but she can't really follow this.  Rare palpitations related to menstrual cycle. Labs followed by Dr Mellody Drown.     Past Medical History:  Diagnosis Date  . Asthma   . History of elevated lipids   . Hypertension   . IC (interstitial cystitis)     Past Surgical History:  Procedure Laterality Date  . GALLBLADDER SURGERY    . WISDOM TOOTH EXTRACTION       Current Outpatient Medications  Medication Sig Dispense Refill  . albuterol (PROVENTIL HFA;VENTOLIN HFA) 108 (90 Base) MCG/ACT inhaler Inhale 2 puffs into the lungs every 6 (six) hours as needed for wheezing or shortness of breath.    Marland Kitchen ibuprofen (ADVIL,MOTRIN) 200 MG tablet Take 200 mg by mouth every 6 (six) hours as needed for moderate pain.    . montelukast (SINGULAIR) 10 MG tablet Take 10 mg by mouth as needed.     . nebivolol (BYSTOLIC) 5 MG tablet Take 1 tablet (5 mg total) by mouth daily. 90 tablet 3   No current  facility-administered medications for this visit.    Allergies:   Aspirin    Social History:  The patient  reports that she has never smoked. She has never used smokeless tobacco. She reports previous alcohol use. She reports that she does not use drugs.   Family History:  The patient's family history includes Breast cancer in her maternal grandmother; Cancer in her paternal grandmother; Heart disease in her father and mother; Hypertension in her father and mother.    ROS:  Please see the history of present illness.   Otherwise, review of systems are positive for none.   All other systems are reviewed and negative.    PHYSICAL EXAM: VS:  BP (!) 144/90   Pulse 65   Ht 5\' 2"  (1.575 m)   Wt 143 lb (64.9 kg)   SpO2 96%   BMI 26.16 kg/m  , BMI Body mass index is 26.16 kg/m. GENERAL:  Well appearing WF in NAD HEENT:  PERRL, EOMI, sclera are clear. Oropharynx is clear. NECK:  No jugular venous distention, carotid upstroke brisk and symmetric, no bruits, no thyromegaly or adenopathy LUNGS:  Clear to auscultation bilaterally CHEST:  Unremarkable HEART:  RRR,  PMI not displaced or sustained,S1 and S2 within normal limits, no S3, no S4: no clicks, no rubs, no murmurs ABD:  Soft, nontender. BS +, no masses or bruits. No hepatomegaly, no splenomegaly EXT:  2 + pulses throughout, no edema, no cyanosis no clubbing SKIN:  Warm and dry.  No rashes NEURO:  Alert and oriented x 3. Cranial nerves II through XII intact. PSYCH:  Cognitively intact   EKG:  EKG is ordered today. NSR rate 65. Normal. I have personally reviewed and interpreted this study.     Recent Labs: No results found for requested labs within last 8760 hours.    Lipid Panel No results found for: CHOL, TRIG, HDL, CHOLHDL, VLDL, LDLCALC, LDLDIRECT    Wt Readings from Last 3 Encounters:  07/26/20 143 lb (64.9 kg)  06/13/20 142 lb 12.8 oz (64.8 kg)  05/22/19 141 lb (64 kg)      Other studies Reviewed: Additional  studies/ records that were reviewed today include:   Stress Echo 05/01/17: Study Conclusions  - Stress ECG conclusions: The stress ECG was normal. - Staged echo: Normal echo stress  Impressions:  - Normal study after maximal exercise.   ASSESSMENT AND PLAN:  1.  Palpitations. Chronic and much improved on Bystolic.  Prior evaluation in 2014 unremarkable. Continue beta blocker therapy.  2. HTN. BP does fluctuate. Did not tolerate 10 mg of Bystolic well. Felt very fatigued. Doing oK on current therapy. Encourage lifestyle modification with more of a plant based or Mediterranean diet.    Follow up one year  Signed, Peter Martinique, MD  07/26/2020 9:12 AM    [facility] [address] [place], [place], [address] Phone [phone], Fax [phone]

## 2020-07-25 NOTE — Telephone Encounter (Signed)
Called patient no answer.Left message on personal voice mail to keep appointment with [name] as planned 3/18 at 8:40 am to discuss.

## 2020-07-26 ENCOUNTER — Other Ambulatory Visit: Payer: Self-pay | Admitting: Cardiology

## 2020-07-26 ENCOUNTER — Other Ambulatory Visit: Payer: Self-pay

## 2020-07-26 ENCOUNTER — Ambulatory Visit (INDEPENDENT_AMBULATORY_CARE_PROVIDER_SITE_OTHER): Payer: PRIVATE HEALTH INSURANCE | Admitting: Cardiology

## 2020-07-26 ENCOUNTER — Encounter: Payer: Self-pay | Admitting: Cardiology

## 2020-07-26 VITALS — BP 144/90 | HR 65 | Ht 62.0 in | Wt 143.0 lb

## 2020-07-26 DIAGNOSIS — I10 Essential (primary) hypertension: Secondary | ICD-10-CM | POA: Diagnosis not present

## 2020-07-26 DIAGNOSIS — R002 Palpitations: Secondary | ICD-10-CM

## 2020-07-26 MED ORDER — NEBIVOLOL HCL 5 MG PO TABS: 5.0000 mg | ORAL_TABLET | Freq: Every day | ORAL | 3 refills | Status: DC

## 2020-07-26 NOTE — Patient Instructions (Signed)
Mediterranean Diet A Mediterranean diet refers to food and lifestyle choices that are based on the traditions of countries located on the [place]. This way of eating has been shown to help prevent certain conditions and improve outcomes for people who have chronic diseases, like kidney disease and heart disease. What are tips for following this plan? Lifestyle  Cook and eat meals together with your family, when possible.  Drink enough fluid to keep your urine clear or pale yellow.  Be physically active every day. This includes: ? Aerobic exercise like running or swimming. ? Leisure activities like gardening, walking, or housework.  Get 7-8 hours of sleep each night.  If recommended by your health care provider, drink red wine in moderation. This means 1 glass a day for nonpregnant women and 2 glasses a day for men. A glass of wine equals 5 oz (150 mL). Reading food labels  Check the serving size of packaged foods. For foods such as rice and pasta, the serving size refers to the amount of cooked product, not dry.  Check the total fat in packaged foods. Avoid foods that have saturated fat or trans fats.  Check the ingredients list for added sugars, such as corn syrup.   Shopping  At the grocery store, buy most of your food from the areas near the walls of the store. This includes: ? Fresh fruits and vegetables (produce). ? Grains, beans, nuts, and seeds. Some of these may be available in unpackaged forms or large amounts (in bulk). ? Fresh seafood. ? Poultry and eggs. ? Low-fat dairy products.  Buy whole ingredients instead of prepackaged foods.  Buy fresh fruits and vegetables in-season from local farmers markets.  Buy frozen fruits and vegetables in resealable bags.  If you do not have access to quality fresh seafood, buy precooked frozen shrimp or canned fish, such as tuna, salmon, or sardines.  Buy small amounts of raw or cooked vegetables, salads, or olives from  the deli or salad bar at your store.  Stock your pantry so you always have certain foods on hand, such as olive oil, canned tuna, canned tomatoes, rice, pasta, and beans. Cooking  Cook foods with extra-virgin olive oil instead of using butter or other vegetable oils.  Have meat as a side dish, and have vegetables or grains as your main dish. This means having meat in small portions or adding small amounts of meat to foods like pasta or stew.  Use beans or vegetables instead of meat in common dishes like chili or lasagna.  Experiment with different cooking methods. Try roasting or broiling vegetables instead of steaming or sauteing them.  Add frozen vegetables to soups, stews, pasta, or rice.  Add nuts or seeds for added healthy fat at each meal. You can add these to yogurt, salads, or vegetable dishes.  Marinate fish or vegetables using olive oil, lemon juice, garlic, and fresh herbs. Meal planning  Plan to eat 1 vegetarian meal one day each week. Try to work up to 2 vegetarian meals, if possible.  Eat seafood 2 or more times a week.  Have healthy snacks readily available, such as: ? Vegetable sticks with hummus. ? Greek yogurt. ? Fruit and nut trail mix.  Eat balanced meals throughout the week. This includes: ? Fruit: 2-3 servings a day ? Vegetables: 4-5 servings a day ? Low-fat dairy: 2 servings a day ? Fish, poultry, or lean meat: 1 serving a day ? Beans and legumes: 2 or more servings a week ?   Nuts and seeds: 1-2 servings a day ? Whole grains: 6-8 servings a day ? Extra-virgin olive oil: 3-4 servings a day  Limit red meat and sweets to only a few servings a month   What are my food choices?  Mediterranean diet ? Recommended  Grains: Whole-grain pasta. Brown rice. Bulgar wheat. Polenta. Couscous. Whole-wheat bread. Oatmeal. Quinoa.  Vegetables: Artichokes. Beets. Broccoli. Cabbage. Carrots. Eggplant. Green beans. Chard. Kale. Spinach. Onions. Leeks. Peas. Squash.  Tomatoes. Peppers. Radishes.  Fruits: Apples. Apricots. Avocado. Berries. Bananas. Cherries. Dates. Figs. Grapes. Lemons. Melon. Oranges. Peaches. Plums. Pomegranate.  Meats and other protein foods: Beans. Almonds. Sunflower seeds. Pine nuts. Peanuts. Cod. Salmon. Scallops. Shrimp. Tuna. Tilapia. Clams. Oysters. Eggs.  Dairy: Low-fat milk. Cheese. Greek yogurt.  Beverages: Water. Red wine. Herbal tea.  Fats and oils: Extra virgin olive oil. Avocado oil. Grape seed oil.  Sweets and desserts: Greek yogurt with honey. Baked apples. Poached pears. Trail mix.  Seasoning and other foods: Basil. Cilantro. Coriander. Cumin. Mint. Parsley. Sage. Rosemary. Tarragon. Garlic. Oregano. Thyme. Pepper. Balsalmic vinegar. Tahini. Hummus. Tomato sauce. Olives. Mushrooms. ? Limit these  Grains: Prepackaged pasta or rice dishes. Prepackaged cereal with added sugar.  Vegetables: Deep fried potatoes (french fries).  Fruits: Fruit canned in syrup.  Meats and other protein foods: Beef. Pork. Lamb. Poultry with skin. Hot dogs. Bacon.  Dairy: Ice cream. Sour cream. Whole milk.  Beverages: Juice. Sugar-sweetened soft drinks. Beer. Liquor and spirits.  Fats and oils: Butter. Canola oil. Vegetable oil. Beef fat (tallow). Lard.  Sweets and desserts: Cookies. Cakes. Pies. Candy.  Seasoning and other foods: Mayonnaise. Premade sauces and marinades. The items listed may not be a complete list. Talk with your dietitian about what dietary choices are right for you. Summary  The Mediterranean diet includes both food and lifestyle choices.  Eat a variety of fresh fruits and vegetables, beans, nuts, seeds, and whole grains.  Limit the amount of red meat and sweets that you eat.  Talk with your health care provider about whether it is safe for you to drink red wine in moderation. This means 1 glass a day for nonpregnant women and 2 glasses a day for men. A glass of wine equals 5 oz (150 mL). This information  is not intended to replace advice given to you by your health care provider. Make sure you discuss any questions you have with your health care provider. Document Revised: 12/26/2015 Document Reviewed: 12/19/2015 Elsevier Patient Education  2020 [employer] Inc.  

## 2020-09-09 ENCOUNTER — Other Ambulatory Visit (HOSPITAL_COMMUNITY): Payer: Self-pay

## 2020-09-09 MED ORDER — MONTELUKAST SODIUM 10 MG PO TABS
1.0000 | ORAL_TABLET | Freq: Every day | ORAL | 5 refills | Status: DC
  Filled 2020-09-09: qty 30, 30d supply, fill #0
  Filled 2020-10-09: qty 30, 30d supply, fill #1
  Filled 2021-05-01: qty 30, 30d supply, fill #2
  Filled 2021-07-30: qty 30, 30d supply, fill #3

## 2020-09-10 ENCOUNTER — Other Ambulatory Visit (HOSPITAL_COMMUNITY): Payer: Self-pay

## 2020-09-26 ENCOUNTER — Other Ambulatory Visit: Payer: Self-pay

## 2020-09-26 ENCOUNTER — Ambulatory Visit
Admission: RE | Admit: 2020-09-26 | Discharge: 2020-09-26 | Disposition: A | Discharge status: Home / Self Care | Payer: PRIVATE HEALTH INSURANCE | Source: Ambulatory Visit | Attending: Internal Medicine | Admitting: Internal Medicine

## 2020-09-26 ENCOUNTER — Other Ambulatory Visit: Payer: Self-pay | Admitting: Internal Medicine

## 2020-09-26 DIAGNOSIS — Z1231 Encounter for screening mammogram for malignant neoplasm of breast: Secondary | ICD-10-CM

## 2020-10-01 ENCOUNTER — Other Ambulatory Visit: Payer: Self-pay | Admitting: Internal Medicine

## 2020-10-01 DIAGNOSIS — R928 Other abnormal and inconclusive findings on diagnostic imaging of breast: Secondary | ICD-10-CM

## 2020-10-09 ENCOUNTER — Other Ambulatory Visit (HOSPITAL_COMMUNITY): Payer: Self-pay

## 2020-10-09 MED FILL — Nebivolol HCl Tab 5 MG (Base Equivalent): ORAL | 90 days supply | Qty: 90 | Fill #0 | Status: AC

## 2020-10-17 ENCOUNTER — Ambulatory Visit
Admission: RE | Admit: 2020-10-17 | Discharge: 2020-10-17 | Disposition: A | Discharge status: Home / Self Care | Payer: PRIVATE HEALTH INSURANCE | Source: Ambulatory Visit | Attending: Internal Medicine | Admitting: Internal Medicine

## 2020-10-17 ENCOUNTER — Other Ambulatory Visit: Payer: Self-pay

## 2020-10-17 DIAGNOSIS — R928 Other abnormal and inconclusive findings on diagnostic imaging of breast: Secondary | ICD-10-CM

## 2020-11-28 IMAGING — MG DIGITAL SCREENING BILAT W/ TOMO W/ CAD
8 series · 9 of 24 positions shown · non-contrast
Comparison: Previous exam(s).

CLINICAL DATA: Screening.

EXAM:
DIGITAL SCREENING BILATERAL MAMMOGRAM WITH TOMO AND CAD

[R CC synth-2D]
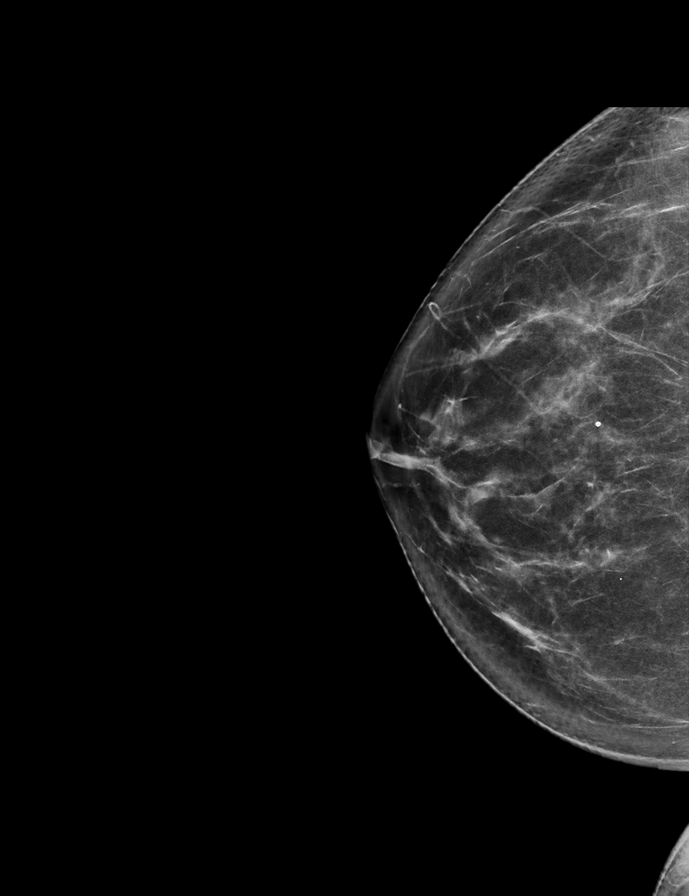

[L MLO synth-2D]
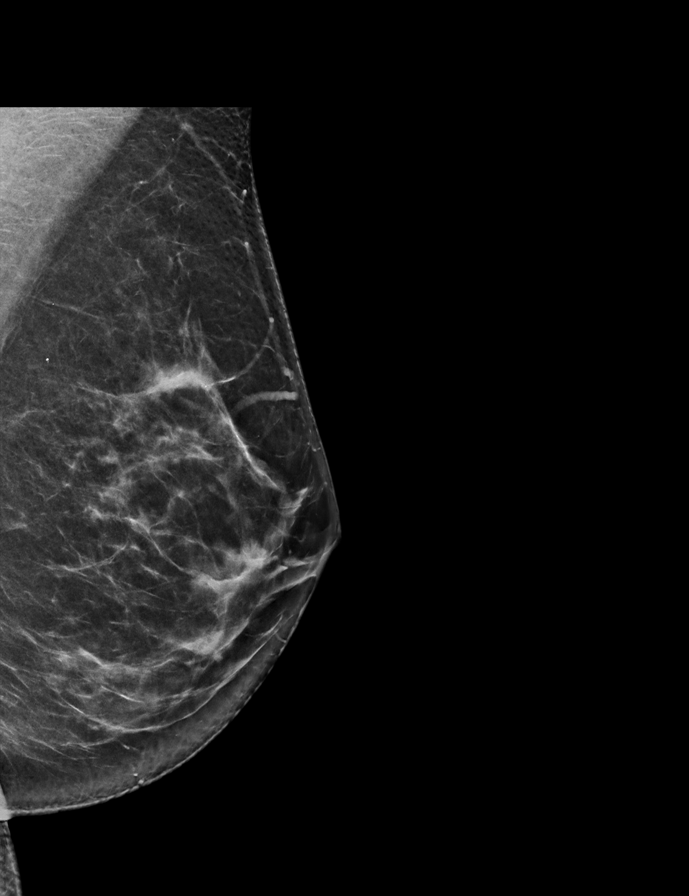

[L CC synth-2D]
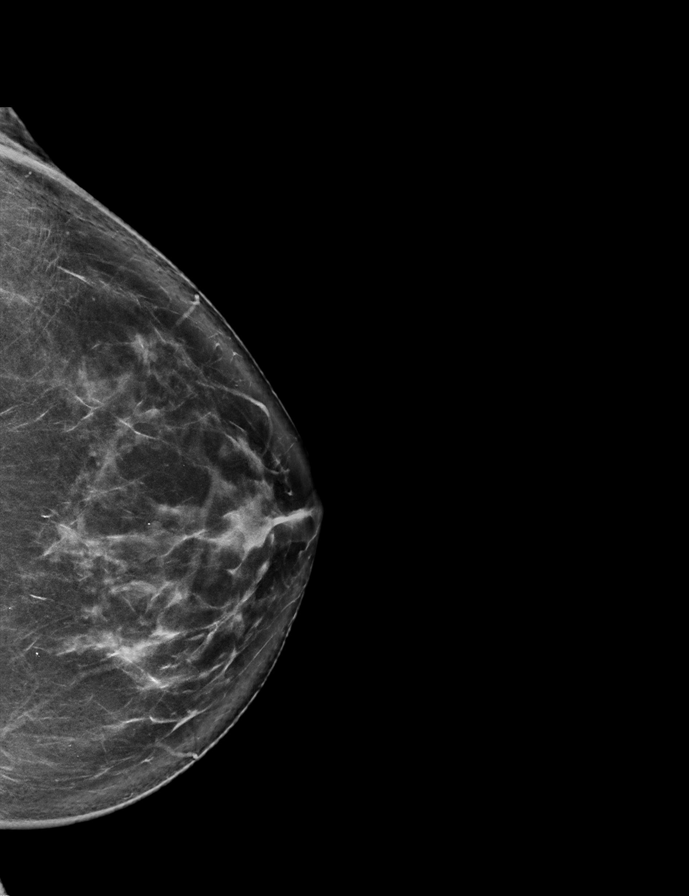

[R MLO synth-2D]
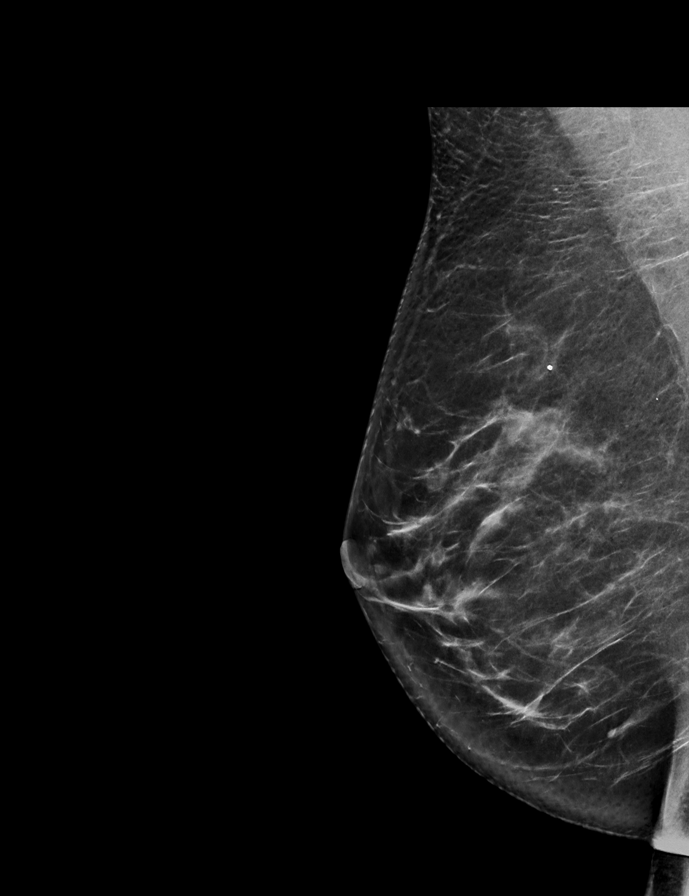

[L CC tomo · 2 of 77 frames shown]
[frame 25/77]
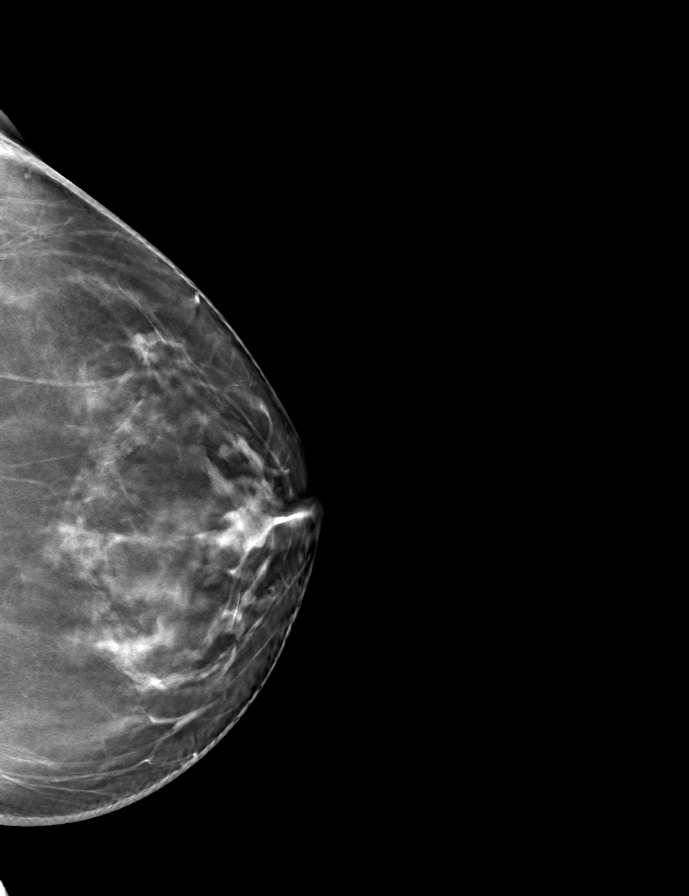
[frame 39/77]
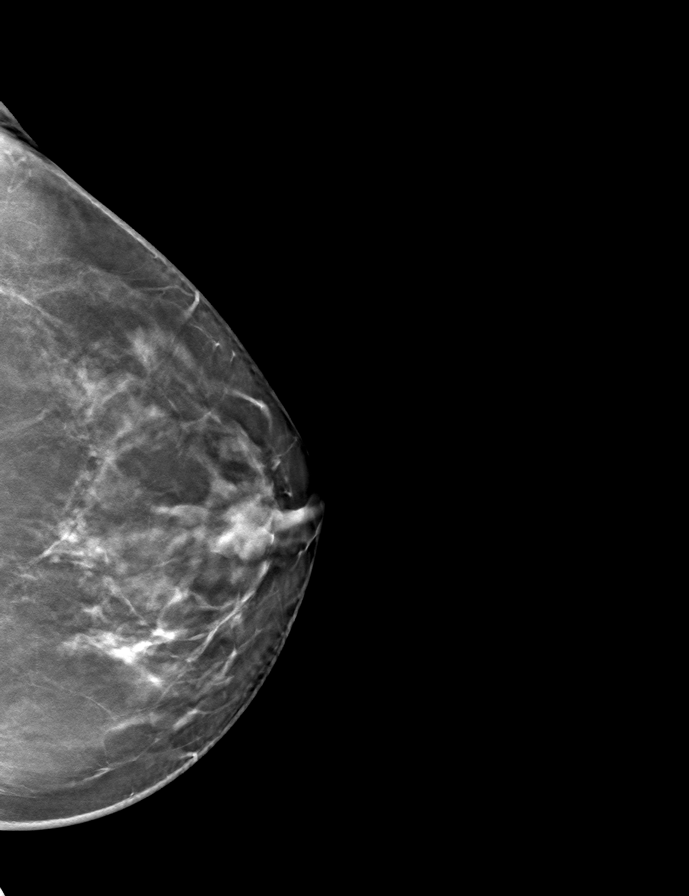

[R MLO tomo · tomo slice 38/75.0]
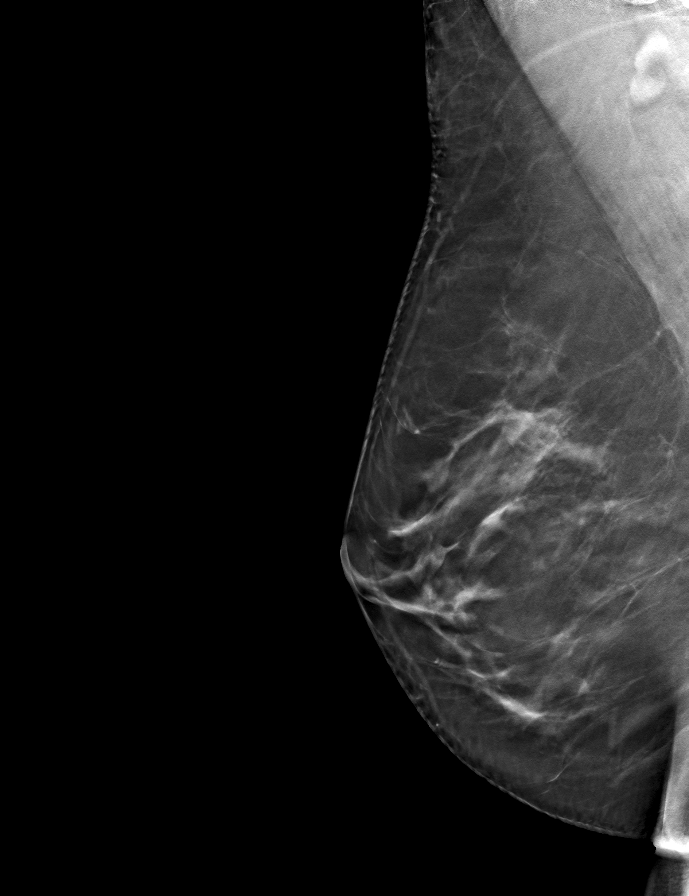

[L MLO tomo · tomo slice 35/70.0]
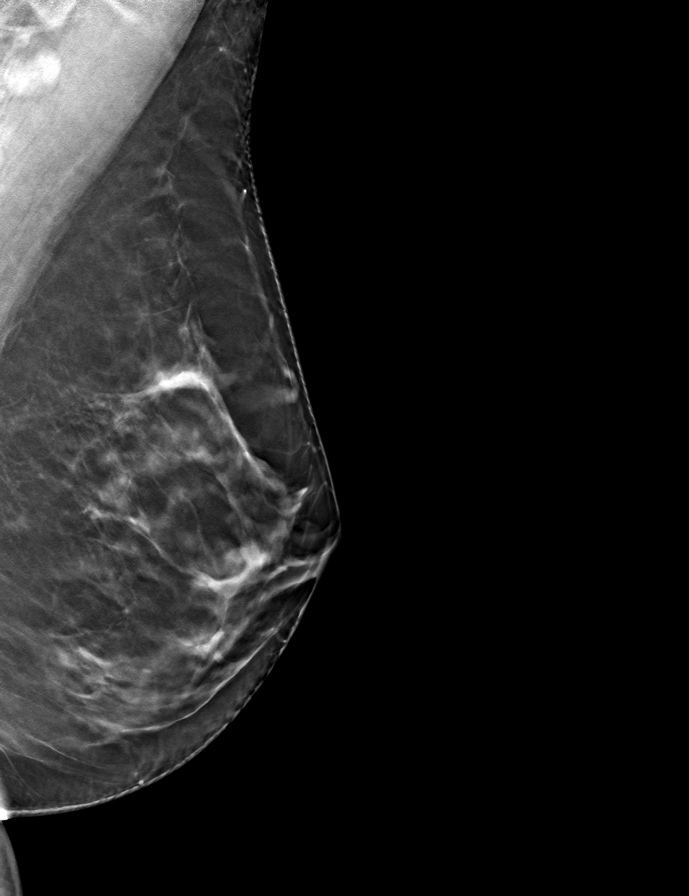

[R CC tomo · tomo slice 37/73.0]
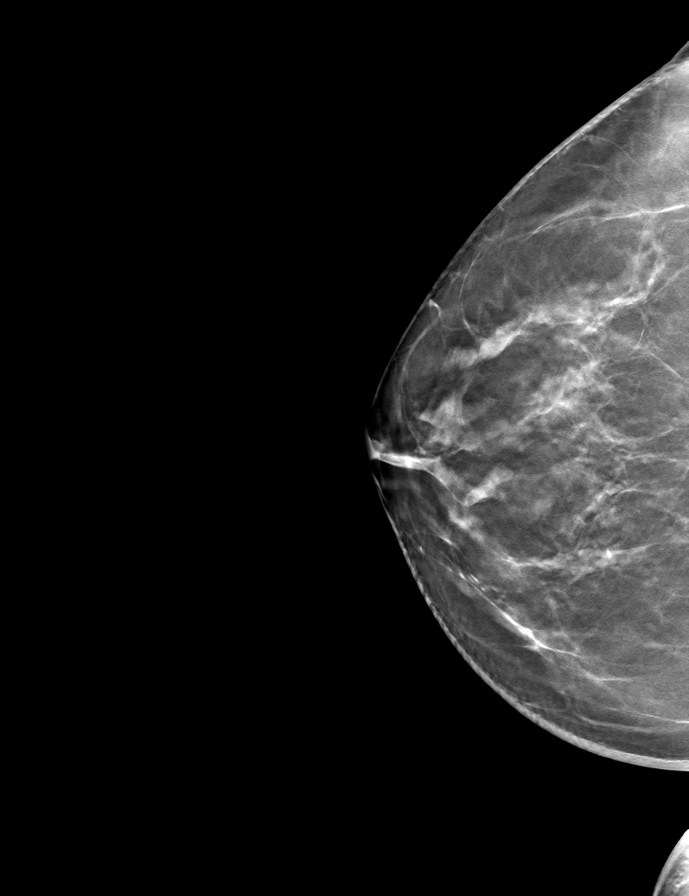

[9 of 24 positions shown; findings below may reference images not displayed]

ACR Breast Density Category c: The breast tissue is heterogeneously
dense, which may obscure small masses.
FINDINGS: There are no findings suspicious for malignancy. Images were
processed with CAD.
IMPRESSION: No mammographic evidence of malignancy. A result letter of this
screening mammogram will be mailed directly to the patient.

RECOMMENDATION:
Screening mammogram in one year. (Code:[id number])

BI-RADS CATEGORY  1: Negative.

## 2021-01-14 ENCOUNTER — Telehealth: Payer: Self-pay | Admitting: Cardiology

## 2021-01-14 NOTE — Telephone Encounter (Signed)
Pt c/o BP issue: STAT if pt c/o blurred vision, one-sided weakness or slurred speech  1. What are your last 5 BP readings? 163/83, 159/83, 173/86, 135/70, 142/76  2. Are you having any other symptoms (ex. Dizziness, headache, blurred vision, passed out)? Lightheaded   3. What is your BP issue? Bp is high

## 2021-01-14 NOTE — Telephone Encounter (Signed)
Returned call to patient of Dr. [place]. She reports her BP had been doing well, in the [id number] systolic. Last week, she noticed it was creeping up to 140s/150s.   173/86 - she had 2 servings of soup prior this this reading but before/after readings were in the 150s  She takes Bystolic '5mg'$  daily -- she tried '10mg'$  of this med but it "dragged her down" some but she is willing to increase dose if recommended  Advised will send to MD, pharmacy team to review and advise

## 2021-01-15 MED ORDER — NEBIVOLOL HCL 10 MG PO TABS: 10.0000 mg | ORAL_TABLET | Freq: Every day | ORAL | 1 refills | Status: DC

## 2021-01-15 NOTE — Telephone Encounter (Signed)
Spoke with the pt and endorsed to her, recommendations per [facility] at Dr. Doug Sou office. Advised the pt to increase her bystolic to 10 mg po daily, allow 7-10 days to see if feelings of "fatigue and dragged down"  resolve.  Advised the pt to call the office back in 2 weeks and let us know how she is feeling, and if complaints of feeling "dragged down" are still there, we can advise on switching her to an alternative regimen. Confirmed the pharmacy of choice with the pt.  Pt states she will use her current supply on hand and call the pharmacy when ready to refill her bystolic 10 mg tablets.  Note placed to pharmacy about this.  Pt verbalized understanding and agrees with this plan.

## 2021-01-15 NOTE — Telephone Encounter (Signed)
Have her increase Bystolic to 10 mg daily.  Advise her that it often takes 7-10 days to get past that "dragged down" feeling.  If still feels that way after 2 weeks, let us know and we can try something different.

## 2021-01-31 ENCOUNTER — Other Ambulatory Visit (HOSPITAL_COMMUNITY): Payer: Self-pay

## 2021-01-31 ENCOUNTER — Other Ambulatory Visit: Payer: Self-pay | Admitting: Cardiology

## 2021-01-31 ENCOUNTER — Telehealth: Payer: Self-pay | Admitting: Cardiology

## 2021-01-31 MED ORDER — NEBIVOLOL HCL 10 MG PO TABS: 10.0000 mg | ORAL_TABLET | Freq: Every day | ORAL | 3 refills | Status: DC

## 2021-01-31 NOTE — Telephone Encounter (Signed)
*  STAT* If patient is at the pharmacy, call can be transferred to refill team.   1. Which medications need to be refilled? (please list name of each medication and dose if known)  new prescription for Bystolic 10 mg   2. Which pharmacy/location (including street and city if local pharmacy) is medication to be sent to?[facility]  3. Do they need a 30 day or 90 day supply? 90 days and refills- please call today, pt only have 2 pills left

## 2021-02-03 ENCOUNTER — Other Ambulatory Visit (HOSPITAL_COMMUNITY): Payer: Self-pay

## 2021-02-04 ENCOUNTER — Other Ambulatory Visit: Payer: Self-pay | Admitting: Cardiology

## 2021-02-04 ENCOUNTER — Other Ambulatory Visit (HOSPITAL_COMMUNITY): Payer: Self-pay

## 2021-02-04 MED ORDER — NEBIVOLOL HCL 10 MG PO TABS: 10.0000 mg | ORAL_TABLET | Freq: Every day | ORAL | 3 refills | Status: DC

## 2021-02-04 MED ORDER — NEBIVOLOL HCL 10 MG PO TABS
10.0000 mg | ORAL_TABLET | Freq: Every day | ORAL | 3 refills | Status: DC
  Filled 2021-02-04: qty 90, 90d supply, fill #0
  Filled 2021-05-06: qty 90, 90d supply, fill #1
  Filled 2021-07-30: qty 90, 90d supply, fill #2

## 2021-02-05 ENCOUNTER — Other Ambulatory Visit (HOSPITAL_COMMUNITY): Payer: Self-pay

## 2021-02-17 ENCOUNTER — Other Ambulatory Visit (HOSPITAL_COMMUNITY): Payer: Self-pay

## 2021-02-17 MED ORDER — PROAIR DIGIHALER 108 (90 BASE) MCG/ACT IN AEPB
2.0000 | INHALATION_SPRAY | RESPIRATORY_TRACT | 3 refills | Status: DC | PRN
  Filled 2021-02-17: qty 1, 17d supply, fill #0

## 2021-02-18 ENCOUNTER — Other Ambulatory Visit (HOSPITAL_COMMUNITY): Payer: Self-pay

## 2021-03-17 ENCOUNTER — Other Ambulatory Visit (HOSPITAL_COMMUNITY): Payer: Self-pay

## 2021-05-01 ENCOUNTER — Other Ambulatory Visit (HOSPITAL_COMMUNITY): Payer: Self-pay

## 2021-05-07 ENCOUNTER — Other Ambulatory Visit (HOSPITAL_COMMUNITY): Payer: Self-pay

## 2021-05-21 ENCOUNTER — Telehealth: Payer: Self-pay

## 2021-05-21 NOTE — Telephone Encounter (Signed)
Patient called because she had noticed an indentation on the side of her left breast.  I called her back and recommended office visit for breast exam. She is agreeable and message sent to appt desk to call her and schedule visit for breast exam.

## 2021-05-22 ENCOUNTER — Ambulatory Visit: Payer: PRIVATE HEALTH INSURANCE | Admitting: Nurse Practitioner

## 2021-05-22 ENCOUNTER — Encounter: Payer: Self-pay | Admitting: Nurse Practitioner

## 2021-05-22 ENCOUNTER — Other Ambulatory Visit: Payer: Self-pay

## 2021-05-22 VITALS — BP 122/74

## 2021-05-22 DIAGNOSIS — N6452 Nipple discharge: Secondary | ICD-10-CM

## 2021-05-22 DIAGNOSIS — R234 Changes in skin texture: Secondary | ICD-10-CM

## 2021-05-22 NOTE — Telephone Encounter (Signed)
Breast exam scheduled 03/22/22.

## 2021-05-22 NOTE — Progress Notes (Signed)
° °  Acute Office Visit  Subjective:    Patient ID: Faith Miller, female    DOB: 11/17/1965, 56 y.o.   MRN: [id number]   HPI 56 y.o. presents today for breast problem. She recently noticed an area of dimpling in left breast. Denies mass, pain, redness, swelling. She is unsure if she has had nipple discharge. She had an episode recently where she noticed stains on her shirt where both nipples would be. She does apply lotion to breasts and is unsure if it was oils from that. She has not been able to express any discharge. Normal mammogram history with most recent 09/26/2020 (benign cyst seen in right breast, otherwise unremarkable). MGM with history of breast cancer, no other family history.    Review of Systems  Constitutional: Negative.   Hematological: Negative.   Left breast: Positive for skin change. Negative for mass, redness, swelling, inverted nipple. Unsure of nipple discharge Right breast: Negative but unsure of nipple discharge    Objective:    Physical Exam Constitutional:      Appearance: Normal appearance.  Chest:  Breasts:    Right: Normal.     Left: Skin change (Slight dimpling/indentation in sitting position with arms relaxed. Appears to resolve when arms placed on hips) present. No swelling, bleeding, inverted nipple, mass, nipple discharge or tenderness.    BP 122/74    LMP 05/03/2021  Wt Readings from Last 3 Encounters:  07/26/20 143 lb (64.9 kg)  06/13/20 142 lb 12.8 oz (64.8 kg)  05/22/19 141 lb (64 kg)        Assessment & Plan:   Problem List Items Addressed This Visit   None Visit Diagnoses     Change of skin of breast    -  Primary   Nipple discharge       Relevant Orders   Prolactin      Plan: Will check prolactin due to unclear presence of nipple discharge. Will send referral for left breast ultrasound.      [place], 4:10 PM 05/22/2021

## 2021-05-23 ENCOUNTER — Telehealth: Payer: Self-pay | Admitting: *Deleted

## 2021-05-23 DIAGNOSIS — R234 Changes in skin texture: Secondary | ICD-10-CM

## 2021-05-23 LAB — PROLACTIN: Prolactin: 5.8 ng/mL

## 2021-05-23 NOTE — Telephone Encounter (Signed)
Patient scheduled on 05/27/21 @ 10:40am at the breast center. Patient will need to diagnostic left breast and left breast ultrasound. Patient aware of time and date.

## 2021-05-23 NOTE — Telephone Encounter (Signed)
-----   Message from Tamela Gammon, NP sent at 05/22/2021  4:11 PM EST ----- Regarding: Left breast ultrasound Please send referral for left breast ultrasound for skin changes (dimpling/indentation).

## 2021-05-27 ENCOUNTER — Ambulatory Visit
Admission: RE | Admit: 2021-05-27 | Discharge: 2021-05-27 | Disposition: A | Discharge status: Home / Self Care | Payer: PRIVATE HEALTH INSURANCE | Source: Ambulatory Visit | Attending: Nurse Practitioner | Admitting: Nurse Practitioner

## 2021-05-27 ENCOUNTER — Other Ambulatory Visit: Payer: Self-pay

## 2021-05-27 DIAGNOSIS — R234 Changes in skin texture: Secondary | ICD-10-CM

## 2021-06-16 ENCOUNTER — Ambulatory Visit: Payer: PRIVATE HEALTH INSURANCE | Admitting: Nurse Practitioner

## 2021-06-16 NOTE — Progress Notes (Deleted)
° °  Faith Miller 03-27-1966 [id number]   History:  56 y.o. G3P3 presents for annual exam. Most regular monthly cycles. Normal pap and mammogram history. Normal left breast ultrasound 05/2021 for indentation. HTN managed by PCP.   Gynecologic History No LMP recorded.   Contraception/Family planning: rhythm method Sexually active: Yes  Health Maintenance Last Pap: 05/16/2018. Results were: Normal, 5-year repeat Last mammogram: 09/26/2020. Results were: Possible mass in right breast, F/U ultrasound  - benign cyst Last colonoscopy: 2015. Results were: polyp x 3. Scheduled in March Last Dexa: Not indicated  Past medical history, past surgical history, family history and social history were all reviewed and documented in the EPIC chart. Married. 3 daughters. MGM with history of breast cancer, PGM with history of colon cancer.   ROS:  A ROS was performed and pertinent positives and negatives are included.  Exam:  There were no vitals filed for this visit.  There is no height or weight on file to calculate BMI.  General appearance:  Normal Thyroid:  Symmetrical, normal in size, without palpable masses or nodularity. Respiratory  Auscultation:  Clear without wheezing or rhonchi Cardiovascular  Auscultation:  Regular rate, without rubs, murmurs or gallops  Edema/varicosities:  Not grossly evident Abdominal  Soft,nontender, without masses, guarding or rebound.  Liver/spleen:  No organomegaly noted  Hernia:  None appreciated  Skin  Inspection:  Grossly normal   Breasts: Examined lying and sitting.   Right: Without masses, retractions, discharge or axillary adenopathy.   Left: Without masses, retractions, discharge or axillary adenopathy. Genitourinary   Inguinal/mons:  Normal without inguinal adenopathy  External genitalia:  Normal appearing vulva with no masses, tenderness, or lesions  BUS/Urethra/Skene's glands:  Normal  Vagina:  Normal appearing with normal color and  discharge, no lesions  Cervix:  Normal appearing without discharge or lesions  Uterus:  Normal in size, shape and contour.  Midline and mobile, nontender  Adnexa/parametria:     Rt: Normal in size, without masses or tenderness.   Lt: Normal in size, without masses or tenderness.  Anus and perineum: Normal  Digital rectal exam: Normal sphincter tone without palpated masses or tenderness  Patient informed chaperone available to be present for breast and pelvic exam. Patient has requested no chaperone to be present. Patient has been advised what will be completed during breast and pelvic exam.   Assessment/Plan:  56 y.o. G3P3 for annual exam.   Well female exam with routine gynecological exam - Education provided on SBEs, importance of preventative screenings, current guidelines, high calcium diet, regular exercise, and multivitamin daily. Labs with PCP.   Screening for cervical cancer - Normal Pap history.  Will repeat at 5-year interval per guidelines.  Screening for breast cancer - Normal mammogram history.  Continue annual screenings.  Normal breast exam today.  Screening for colon cancer - 2015 colonoscopy. Sees GI in March.  Follow up in 1 year for annual.     Tamela Gammon [facility], 8:00 AM 06/16/2021

## 2021-07-03 ENCOUNTER — Other Ambulatory Visit: Payer: Self-pay

## 2021-07-03 ENCOUNTER — Ambulatory Visit (INDEPENDENT_AMBULATORY_CARE_PROVIDER_SITE_OTHER): Payer: PRIVATE HEALTH INSURANCE | Admitting: Nurse Practitioner

## 2021-07-03 ENCOUNTER — Encounter: Payer: Self-pay | Admitting: Nurse Practitioner

## 2021-07-03 VITALS — BP 122/80 | Ht 62.0 in | Wt 138.0 lb

## 2021-07-03 DIAGNOSIS — Z01419 Encounter for gynecological examination (general) (routine) without abnormal findings: Secondary | ICD-10-CM | POA: Diagnosis not present

## 2021-07-03 DIAGNOSIS — N951 Menopausal and female climacteric states: Secondary | ICD-10-CM | POA: Diagnosis not present

## 2021-07-03 NOTE — Progress Notes (Signed)
° °  Faith Miller 11-10-1965 [id number]   History:  56 y.o. G3P3 presents for annual exam. Cycles have become slightly irregular. She did not have cycle in January. Started menses today. She is having mild night sweats and hot flashes. Normal pap and mammogram history. Normal left breast ultrasound 05/2021 for indentation. HTN managed by PCP.   Gynecologic History Patient's last menstrual period was 07/03/2021. Period Duration (Days): 4 Period Pattern: (!) Irregular Menstrual Flow: Moderate, Light Dysmenorrhea: (!) Mild Dysmenorrhea Symptoms: Cramping Contraception/Family planning: rhythm method Sexually active: Yes  Health Maintenance Last Pap: 05/16/2018. Results were: Normal, 5-year repeat Last mammogram: 09/26/2020. Results were: Possible mass in right breast, F/U ultrasound - benign cyst Last colonoscopy: 07/2020. Results were: Normal, 5-year recall Last Dexa: Not indicated  Past medical history, past surgical history, family history and social history were all reviewed and documented in the EPIC chart. Married. 3 daughters. MGM with history of breast cancer, PGM with history of colon cancer.   ROS:  A ROS was performed and pertinent positives and negatives are included.  Exam:  Vitals:   07/03/21 1520  BP: 122/80  Weight: 138 lb (62.6 kg)  Height: 5\' 2"  (1.575 m)    Body mass index is 25.24 kg/m.  General appearance:  Normal Thyroid:  Symmetrical, normal in size, without palpable masses or nodularity. Respiratory  Auscultation:  Clear without wheezing or rhonchi Cardiovascular  Auscultation:  Regular rate, without rubs, murmurs or gallops  Edema/varicosities:  Not grossly evident Abdominal  Soft,nontender, without masses, guarding or rebound.  Liver/spleen:  No organomegaly noted  Hernia:  None appreciated  Skin  Inspection:  Grossly normal   Breasts: Examined lying and sitting.   Right: Without masses, retractions, discharge or axillary  adenopathy.   Left: Without masses, retractions, discharge or axillary adenopathy. Genitourinary   Inguinal/mons:  Normal without inguinal adenopathy  External genitalia:  Normal appearing vulva with no masses, tenderness, or lesions  BUS/Urethra/Skene's glands:  Normal  Vagina:  Normal appearing with normal color and discharge, no lesions  Cervix:  Normal appearing without discharge or lesions  Uterus:  Normal in size, shape and contour.  Midline and mobile, nontender  Adnexa/parametria:     Rt: Normal in size, without masses or tenderness.   Lt: Normal in size, without masses or tenderness.  Anus and perineum: Normal  Digital rectal exam: Normal sphincter tone without palpated masses or tenderness  Patient informed chaperone available to be present for breast and pelvic exam. Patient has requested no chaperone to be present. Patient has been advised what will be completed during breast and pelvic exam.   Assessment/Plan:  56 y.o. G3P3 for annual exam.   Well female exam with routine gynecological exam - Education provided on SBEs, importance of preventative screenings, current guidelines, high calcium diet, regular exercise, and multivitamin daily. Labs with PCP.   Perimenopausal - We discussed what to expect during this transition with bleeding patterns and symptoms. She is not interested in hormonal contraception for bleeding control due to religion.   Screening for cervical cancer - Normal Pap history.  Will repeat at 5-year interval per guidelines.  Screening for breast cancer - Normal mammogram history.  Continue annual screenings.  Normal breast exam today.  Screening for colon cancer - 2022 colonoscopy. Will repeat at 5-year interval per GI recommendation.   Follow up in 1 year for annual.     Tamela Gammon [facility], 3:30 PM 07/03/2021

## 2021-07-30 ENCOUNTER — Other Ambulatory Visit (HOSPITAL_COMMUNITY): Payer: Self-pay

## 2021-10-07 ENCOUNTER — Other Ambulatory Visit: Payer: Self-pay | Admitting: Nurse Practitioner

## 2021-10-07 ENCOUNTER — Telehealth: Payer: Self-pay | Admitting: Cardiology

## 2021-10-07 ENCOUNTER — Other Ambulatory Visit (HOSPITAL_COMMUNITY): Payer: Self-pay

## 2021-10-07 DIAGNOSIS — Z1231 Encounter for screening mammogram for malignant neoplasm of breast: Secondary | ICD-10-CM

## 2021-10-07 MED ORDER — NEBIVOLOL HCL 10 MG PO TABS
10.0000 mg | ORAL_TABLET | Freq: Every day | ORAL | 0 refills | Status: DC
  Filled 2021-10-07 – 2021-10-29 (×2): qty 90, 90d supply, fill #0

## 2021-10-07 NOTE — Telephone Encounter (Signed)
Medication refilled  Appointment with MD in August 2023

## 2021-10-07 NOTE — Telephone Encounter (Signed)
*  STAT* If patient is at the pharmacy, call can be transferred to refill team.   1. Which medications need to be refilled? (please list name of each medication and dose if known) nebivolol (BYSTOLIC) 10 MG tablet  2. Which pharmacy/location (including street and city if local pharmacy) is medication to be sent to? [address] [place] [address]  Phone:  [phone]  Fax:  [phone]   3. Do they need a 30 day or 90 day supply? [place]

## 2021-10-10 ENCOUNTER — Ambulatory Visit
Admission: RE | Admit: 2021-10-10 | Discharge: 2021-10-10 | Disposition: A | Discharge status: Home / Self Care | Payer: PRIVATE HEALTH INSURANCE | Source: Ambulatory Visit | Attending: Nurse Practitioner | Admitting: Nurse Practitioner

## 2021-10-10 DIAGNOSIS — Z1231 Encounter for screening mammogram for malignant neoplasm of breast: Secondary | ICD-10-CM

## 2021-10-17 ENCOUNTER — Other Ambulatory Visit (HOSPITAL_COMMUNITY): Payer: Self-pay

## 2021-10-29 ENCOUNTER — Other Ambulatory Visit (HOSPITAL_COMMUNITY): Payer: Self-pay

## 2021-11-18 ENCOUNTER — Other Ambulatory Visit (HOSPITAL_COMMUNITY): Payer: Self-pay

## 2021-11-25 ENCOUNTER — Other Ambulatory Visit (HOSPITAL_COMMUNITY): Payer: Self-pay

## 2021-11-25 MED ORDER — MONTELUKAST SODIUM 10 MG PO TABS
10.0000 mg | ORAL_TABLET | Freq: Every day | ORAL | 2 refills | Status: DC
  Filled 2021-11-25: qty 10, 10d supply, fill #0
  Filled 2021-11-25: qty 80, 80d supply, fill #0
  Filled 2022-04-27: qty 90, 90d supply, fill #1

## 2021-12-19 NOTE — Progress Notes (Unsigned)
Cardiology Office Note   Date:  12/23/2021   ID:  Faith Miller, DOB 11-15-1965, MRN [id number]  PCP:  Jilda Panda, MD  Cardiologist:   Antonia Jicha Martin[place], MD   Chief Complaint  Patient presents with   Hypertension   Palpitations      History of Present Illness: Faith Miller is a 56 y.o. female who is seen for follow up  of HTN and palpitations. She has a history of asthma, HTN and HLD. She was seen initially in January 2014 for evaluation of palpitations. Event monitor was normal. Echo done in 2013 showed mild mitral regurgitation, otherwise normal.   She was taking a diuretic for BP but this made her feel fatigued- like wearing a lead suit. She was having more palpitations. We switched her to Bystolic and she felt much better. Palpitations were well controlled.   Positive family history with father dying of MI in early 1s. She had a normal stress Echo in 2018.   On follow up today she is doing well. Only mild palpitations related to her hormonal cycle. No significant chest pain. Notes her cholesterol has increased from before up to 212.     Past Medical History:  Diagnosis Date   Asthma    History of elevated lipids    Hypertension    IC (interstitial cystitis)     Past Surgical History:  Procedure Laterality Date   GALLBLADDER SURGERY     WISDOM TOOTH EXTRACTION       Current Outpatient Medications  Medication Sig Dispense Refill   albuterol (PROVENTIL HFA;VENTOLIN HFA) 108 (90 Base) MCG/ACT inhaler Inhale 2 puffs into the lungs every 6 (six) hours as needed for wheezing or shortness of breath.     Albuterol Sulfate, sensor, (PROAIR DIGIHALER) 108 (90 Base) MCG/ACT AEPB Inhale 2 puffs into the lungs every 4-6 hours as needed. 1 each 3   ibuprofen (ADVIL,MOTRIN) 200 MG tablet Take 200 mg by mouth every 6 (six) hours as needed for moderate pain.     montelukast (SINGULAIR) 10 MG tablet Take 10 mg by mouth as needed.      montelukast (SINGULAIR) 10 MG  tablet Take 1 tablet (10 mg total) by mouth daily. 30 tablet 5   montelukast (SINGULAIR) 10 MG tablet Take 1 tablet (10 mg total) by mouth daily. 90 tablet 2   nebivolol (BYSTOLIC) 10 MG tablet Take 1 tablet (10 mg total) by mouth daily. 90 tablet 0   montelukast (SINGULAIR) 10 MG tablet TAKE 1 TABLET BY MOUTH ONCE DAILY 30 tablet 5   No current facility-administered medications for this visit.    Allergies:   Aspirin    Social History:  The patient  reports that she has never smoked. She has never used smokeless tobacco. She reports current alcohol use. She reports that she does not use drugs.   Family History:  The patient's family history includes Breast cancer in her maternal grandmother; Cancer in her paternal grandmother; Heart disease in her father and mother; Hypertension in her father and mother.    ROS:  Please see the history of present illness.   Otherwise, review of systems are positive for none.   All other systems are reviewed and negative.    PHYSICAL EXAM: VS:  BP 130/80   Pulse 63   Ht '5\' 2"'$  (1.575 m)   Wt 142 lb (64.4 kg)   SpO2 97%   BMI 25.97 kg/m  , BMI Body mass index is 25.97 kg/m.  GENERAL:  Well appearing WF in NAD HEENT:  PERRL, EOMI, sclera are clear. Oropharynx is clear. NECK:  No jugular venous distention, carotid upstroke brisk and symmetric, no bruits, no thyromegaly or adenopathy LUNGS:  Clear to auscultation bilaterally CHEST:  Unremarkable HEART:  RRR,  PMI not displaced or sustained,S1 and S2 within normal limits, no S3, no S4: no clicks, no rubs, gr 2-5/7 systolic murmur RUSB. ABD:  Soft, nontender. BS +, no masses or bruits. No hepatomegaly, no splenomegaly EXT:  2 + pulses throughout, no edema, no cyanosis no clubbing SKIN:  Warm and dry.  No rashes NEURO:  Alert and oriented x 3. Cranial nerves II through XII intact. PSYCH:  Cognitively intact   EKG:  EKG is ordered today. NSR rate 63. Normal. I have personally reviewed and interpreted  this study.     Recent Labs: No results found for requested labs within last 365 days.    Lipid Panel No results found for: "CHOL", "TRIG", "HDL", "CHOLHDL", "VLDL", "LDLCALC", "LDLDIRECT"    Wt Readings from Last 3 Encounters:  12/23/21 142 lb (64.4 kg)  07/03/21 138 lb (62.6 kg)  07/26/20 143 lb (64.9 kg)      Other studies Reviewed: Additional studies/ records that were reviewed today include:   Stress Echo 05/01/17: Study Conclusions   - Stress ECG conclusions: The stress ECG was normal. - Staged echo: Normal echo stress   Impressions:   - Normal study after maximal exercise.   ASSESSMENT AND PLAN:  1.  Palpitations. Chronic and much improved on Bystolic.  Prior evaluation in 2014 unremarkable. Continue beta blocker therapy.  2.  HTN. BP is well controlled on Bystolic.  Encourage lifestyle modification with more of a plant based or Mediterranean diet.   3. Hypercholesterolemia. I have recommended a coronary calcium score to further stratify her risk. If abnormal calcification then I would recommend she go on statin therapy. If zero then this could be deferred.     Follow up one year  Signed, Aariya Ferrick Martinique, MD  12/23/2021 2:53 PM    [facility] [address] [place], [place], [address] Phone [phone], Fax [phone]

## 2021-12-23 ENCOUNTER — Encounter: Payer: Self-pay | Admitting: Cardiology

## 2021-12-23 ENCOUNTER — Ambulatory Visit (INDEPENDENT_AMBULATORY_CARE_PROVIDER_SITE_OTHER): Payer: Managed Care, Other (non HMO) | Admitting: Cardiology

## 2021-12-23 VITALS — BP 130/80 | HR 63 | Ht 62.0 in | Wt 142.0 lb

## 2021-12-23 DIAGNOSIS — R002 Palpitations: Secondary | ICD-10-CM | POA: Diagnosis not present

## 2021-12-23 DIAGNOSIS — I10 Essential (primary) hypertension: Secondary | ICD-10-CM

## 2021-12-23 NOTE — Patient Instructions (Signed)
Medication Instructions:  Continue same medications *If you need a refill on your cardiac medications before your next appointment, please call your pharmacy*   Lab Work: None ordered   Testing/Procedures: Coronary Calcium Score   Follow-Up: At [facility], you and your health needs are our priority.  As part of our continuing mission to provide you with exceptional heart care, we have created designated Provider Care Teams.  These Care Teams include your primary Cardiologist (physician) and Advanced Practice Providers (APPs -  Physician Assistants and Nurse Practitioners) who all work together to provide you with the care you need, when you need it.  We recommend signing up for the patient portal called "MyChart".  Sign up information is provided on this After Visit Summary.  MyChart is used to connect with patients for Virtual Visits (Telemedicine).  Patients are able to view lab/test results, encounter notes, upcoming appointments, etc.  Non-urgent messages can be sent to your provider as well.   To learn more about what you can do with MyChart, go to [email or web address].     Your next appointment:  1 Year     Call in May to schedule August appointment     The format for your next appointment: Office    Provider:  [name]   Important Information About Sugar

## 2021-12-31 ENCOUNTER — Ambulatory Visit (HOSPITAL_BASED_OUTPATIENT_CLINIC_OR_DEPARTMENT_OTHER)
Admission: RE | Admit: 2021-12-31 | Discharge: 2021-12-31 | Disposition: A | Discharge status: Home / Self Care | Payer: PRIVATE HEALTH INSURANCE | Source: Ambulatory Visit | Attending: Cardiology | Admitting: Cardiology

## 2021-12-31 DIAGNOSIS — I10 Essential (primary) hypertension: Secondary | ICD-10-CM | POA: Insufficient documentation

## 2021-12-31 DIAGNOSIS — R002 Palpitations: Secondary | ICD-10-CM | POA: Insufficient documentation

## 2022-01-14 ENCOUNTER — Other Ambulatory Visit (HOSPITAL_COMMUNITY): Payer: Self-pay

## 2022-01-14 ENCOUNTER — Other Ambulatory Visit: Payer: Self-pay

## 2022-01-14 DIAGNOSIS — Z79899 Other long term (current) drug therapy: Secondary | ICD-10-CM

## 2022-01-14 DIAGNOSIS — I10 Essential (primary) hypertension: Secondary | ICD-10-CM

## 2022-01-14 MED ORDER — ATORVASTATIN CALCIUM 10 MG PO TABS
10.0000 mg | ORAL_TABLET | Freq: Every day | ORAL | 3 refills | Status: DC
  Filled 2022-01-14: qty 90, 90d supply, fill #0
  Filled 2022-04-27: qty 90, 90d supply, fill #1

## 2022-01-19 ENCOUNTER — Other Ambulatory Visit (HOSPITAL_COMMUNITY): Payer: Self-pay

## 2022-01-28 ENCOUNTER — Other Ambulatory Visit: Payer: Self-pay | Admitting: Cardiology

## 2022-01-28 ENCOUNTER — Other Ambulatory Visit (HOSPITAL_COMMUNITY): Payer: Self-pay

## 2022-01-28 MED ORDER — NEBIVOLOL HCL 10 MG PO TABS
10.0000 mg | ORAL_TABLET | Freq: Every day | ORAL | 3 refills | Status: DC
  Filled 2022-01-28: qty 90, 90d supply, fill #0
  Filled 2022-04-27: qty 90, 90d supply, fill #1
  Filled 2022-08-03: qty 90, 90d supply, fill #2
  Filled 2022-10-27: qty 90, 90d supply, fill #3

## 2022-05-26 ENCOUNTER — Other Ambulatory Visit (HOSPITAL_COMMUNITY): Payer: Self-pay

## 2022-05-26 MED ORDER — ASMANEX HFA 200 MCG/ACT IN AERO
2.0000 | INHALATION_SPRAY | Freq: Two times a day (BID) | RESPIRATORY_TRACT | 5 refills | Status: DC
  Filled 2022-05-26 – 2022-07-20 (×6): qty 13, 30d supply, fill #0

## 2022-05-27 ENCOUNTER — Other Ambulatory Visit (HOSPITAL_COMMUNITY): Payer: Self-pay

## 2022-06-26 ENCOUNTER — Other Ambulatory Visit (HOSPITAL_COMMUNITY): Payer: Self-pay

## 2022-06-26 MED ORDER — ALBUTEROL SULFATE (SENSOR) 108 (90 BASE) MCG/ACT IN AEPB
2.0000 | INHALATION_SPRAY | RESPIRATORY_TRACT | 2 refills | Status: AC | PRN
  Filled 2022-06-26: qty 1, 17d supply, fill #0
  Filled 2022-07-01: qty 1, 25d supply, fill #0
  Filled 2023-06-16: qty 1, 25d supply, fill #1

## 2022-07-01 ENCOUNTER — Other Ambulatory Visit (HOSPITAL_COMMUNITY): Payer: Self-pay

## 2022-07-06 ENCOUNTER — Other Ambulatory Visit (HOSPITAL_COMMUNITY): Payer: Self-pay

## 2022-07-08 ENCOUNTER — Other Ambulatory Visit (HOSPITAL_COMMUNITY): Payer: Self-pay

## 2022-07-13 ENCOUNTER — Other Ambulatory Visit (HOSPITAL_COMMUNITY): Payer: Self-pay

## 2022-07-20 ENCOUNTER — Other Ambulatory Visit (HOSPITAL_COMMUNITY): Payer: Self-pay

## 2022-07-21 ENCOUNTER — Other Ambulatory Visit (HOSPITAL_COMMUNITY): Payer: Self-pay

## 2022-08-19 ENCOUNTER — Other Ambulatory Visit (HOSPITAL_COMMUNITY): Payer: Self-pay

## 2022-08-19 MED ORDER — PROGESTERONE 200 MG PO CAPS
ORAL_CAPSULE | ORAL | 0 refills | Status: DC
  Filled 2022-08-19: qty 60, 84d supply, fill #0

## 2022-08-24 ENCOUNTER — Encounter: Payer: Self-pay | Admitting: Cardiology

## 2022-08-24 ENCOUNTER — Telehealth: Payer: Self-pay | Admitting: Internal Medicine

## 2022-08-24 ENCOUNTER — Other Ambulatory Visit (HOSPITAL_COMMUNITY): Payer: Self-pay

## 2022-08-24 DIAGNOSIS — I10 Essential (primary) hypertension: Secondary | ICD-10-CM

## 2022-08-24 MED ORDER — LOSARTAN POTASSIUM 50 MG PO TABS
50.0000 mg | ORAL_TABLET | Freq: Every day | ORAL | 0 refills | Status: DC
  Filled 2022-08-24: qty 90, 90d supply, fill #0

## 2022-08-24 NOTE — Telephone Encounter (Signed)
  Patient called the overnight cardiology line because of elevated blood pressure. She took a decongestant at 4 am in the morning and measured her blood pressure 1 hr after. She reported 160-180/100 mmHg, asymptomatic with HR of 60-70 bpm. She just took her Bystolic. Since she is asymptomatic and just took her BP medication I asked her to give it some time and recheck her BP in an hour as we would expect it would be coming down. I also instructed her to go to the ER if any symptoms arise (chest pain, neuro changes, etc).

## 2022-08-24 NOTE — Telephone Encounter (Signed)
Pt aware of recommendations and will come in April 30 for BMET ./cy

## 2022-08-26 ENCOUNTER — Encounter: Payer: Self-pay | Admitting: Cardiology

## 2022-09-10 ENCOUNTER — Other Ambulatory Visit: Payer: Self-pay

## 2022-09-10 DIAGNOSIS — I10 Essential (primary) hypertension: Secondary | ICD-10-CM

## 2022-09-11 LAB — BASIC METABOLIC PANEL
BUN/Creatinine Ratio: 13 (ref 9–23)
BUN: 13 mg/dL (ref 6–24)
CO2: 22 mmol/L (ref 20–29)
Calcium: 10 mg/dL (ref 8.7–10.2)
Chloride: 105 mmol/L (ref 96–106)
Creatinine, Ser: 0.98 mg/dL (ref 0.57–1.00)
Glucose: 111 mg/dL — ABNORMAL HIGH (ref 70–99)
Potassium: 4.6 mmol/L (ref 3.5–5.2)
Sodium: 143 mmol/L (ref 134–144)
eGFR: 69 mL/min/{1.73_m2} (ref >59)

## 2022-10-27 ENCOUNTER — Other Ambulatory Visit: Payer: Self-pay | Admitting: Internal Medicine

## 2022-10-27 DIAGNOSIS — Z1231 Encounter for screening mammogram for malignant neoplasm of breast: Secondary | ICD-10-CM

## 2022-10-30 ENCOUNTER — Ambulatory Visit
Admission: RE | Admit: 2022-10-30 | Discharge: 2022-10-30 | Disposition: A | Discharge status: Home / Self Care | Payer: Managed Care, Other (non HMO) | Source: Ambulatory Visit | Attending: Internal Medicine | Admitting: Internal Medicine

## 2022-10-30 DIAGNOSIS — Z1231 Encounter for screening mammogram for malignant neoplasm of breast: Secondary | ICD-10-CM

## 2022-11-19 ENCOUNTER — Telehealth: Payer: Self-pay | Admitting: Cardiology

## 2022-11-19 ENCOUNTER — Other Ambulatory Visit: Payer: Self-pay | Admitting: Cardiology

## 2022-11-19 ENCOUNTER — Other Ambulatory Visit (HOSPITAL_COMMUNITY): Payer: Self-pay

## 2022-11-19 MED ORDER — LOSARTAN POTASSIUM 50 MG PO TABS
50.0000 mg | ORAL_TABLET | Freq: Every day | ORAL | 0 refills | Status: DC
  Filled 2022-11-19: qty 90, 90d supply, fill #0

## 2022-11-19 NOTE — Telephone Encounter (Signed)
Pt's medication was sent to pt's pharmacy as requested. Confirmation received.  °

## 2022-11-19 NOTE — Telephone Encounter (Signed)
*  STAT* If patient is at the pharmacy, call can be transferred to refill team.   1. Which medications need to be refilled? (please list name of each medication and dose if known)   losartan (COZAAR) 50 MG tablet    Take 1 tablet (50 mg total) by mouth daily.   2. Which pharmacy/location (including street and city if local pharmacy) is medication to be sent to?[place] [facility]   3. Do they need a 30 day or 90 day supply? 90 Day Supply

## 2023-01-06 ENCOUNTER — Other Ambulatory Visit (HOSPITAL_COMMUNITY): Payer: Self-pay

## 2023-01-06 MED ORDER — PROGESTERONE 200 MG PO CAPS
400.0000 mg | ORAL_CAPSULE | Freq: Every day | ORAL | 0 refills | Status: DC
  Filled 2023-01-06: qty 60, 84d supply, fill #0

## 2023-01-13 ENCOUNTER — Other Ambulatory Visit (HOSPITAL_COMMUNITY): Payer: Self-pay

## 2023-02-12 ENCOUNTER — Other Ambulatory Visit (HOSPITAL_COMMUNITY): Payer: Self-pay

## 2023-02-12 ENCOUNTER — Other Ambulatory Visit: Payer: Self-pay

## 2023-02-12 ENCOUNTER — Other Ambulatory Visit: Payer: Self-pay | Admitting: Cardiology

## 2023-02-12 MED ORDER — NEBIVOLOL HCL 10 MG PO TABS
10.0000 mg | ORAL_TABLET | Freq: Every day | ORAL | 3 refills | Status: DC
  Filled 2023-02-12: qty 90, 90d supply, fill #0
  Filled 2023-05-11: qty 90, 90d supply, fill #1
  Filled 2023-08-13: qty 90, 90d supply, fill #2
  Filled 2023-11-08: qty 90, 90d supply, fill #3

## 2023-02-12 MED ORDER — LOSARTAN POTASSIUM 50 MG PO TABS
50.0000 mg | ORAL_TABLET | Freq: Every day | ORAL | 0 refills | Status: DC
  Filled 2023-02-12: qty 90, 90d supply, fill #0

## 2023-02-13 NOTE — Progress Notes (Signed)
Cardiology Office Note   Date:  02/19/2023   ID:  Faith Miller, DOB Oct 14, 1965, MRN [id number]  PCP:  Ralene Ok, MD  Cardiologist:   Draven Natter [place], MD   Chief Complaint  Patient presents with   Follow-up    1 year.   Chest Pain    Discomfirt.      History of Present Illness: Faith Miller is a 57 y.o. female who is seen for follow up  of HTN and palpitations. She has a history of asthma, HTN and HLD. She was seen initially in January 2014 for evaluation of palpitations. Event monitor was normal. Echo done in 2013 showed mild mitral regurgitation, otherwise normal.   She was taking a diuretic for BP but this made her feel fatigued- like wearing a lead suit. She was having more palpitations. We switched her to Bystolic and she felt much better. Palpitations were well controlled.   Positive family history with father dying of MI in early 3s. She had a normal stress Echo in 2018.   She had a coronary calcium score last year of 11. We did recommend she go on statin therapy. She did start but thinks there was an interaction with her HRT so stopped taking it. Is going to try and go back on it. Note she is menopausal now and has been on HRT. With this she has noted more palpitations but attributes this to the hormones.     Past Medical History:  Diagnosis Date   Asthma    History of elevated lipids    Hypertension    IC (interstitial cystitis)     Past Surgical History:  Procedure Laterality Date   GALLBLADDER SURGERY     WISDOM TOOTH EXTRACTION       Current Outpatient Medications  Medication Sig Dispense Refill   albuterol (PROVENTIL HFA;VENTOLIN HFA) 108 (90 Base) MCG/ACT inhaler Inhale 2 puffs into the lungs every 6 (six) hours as needed for wheezing or shortness of breath.     Albuterol Sulfate, sensor, (PROAIR DIGIHALER) 108 (90 Base) MCG/ACT AEPB Inhale 2 puffs into the lungs every 4-6 hours as needed. 1 each 2   ibuprofen (ADVIL,MOTRIN) 200 MG  tablet Take 200 mg by mouth every 6 (six) hours as needed for moderate pain.     losartan (COZAAR) 50 MG tablet Take 1 tablet (50 mg total) by mouth daily. 90 tablet 0   Mometasone Furoate (ASMANEX HFA) 200 MCG/ACT AERO Inhale 2 puffs into the lungs in the morning and in the evening 13 g 5   montelukast (SINGULAIR) 10 MG tablet Take 10 mg by mouth as needed.      montelukast (SINGULAIR) 10 MG tablet Take 1 tablet (10 mg total) by mouth daily. 30 tablet 5   montelukast (SINGULAIR) 10 MG tablet Take 1 tablet (10 mg total) by mouth daily. 90 tablet 2   nebivolol (BYSTOLIC) 10 MG tablet Take 1 tablet (10 mg total) by mouth daily. 90 tablet 3   progesterone (PROMETRIUM) 200 MG capsule Take 1 capsule by mouth and place 1 capsule vaginally at bedtime for 10 nights starting 3 days after ovulation 60 capsule 0   atorvastatin (LIPITOR) 10 MG tablet Take 1 tablet (10 mg total) by mouth daily. (Patient not taking: Reported on 02/19/2023) 90 tablet 3   montelukast (SINGULAIR) 10 MG tablet TAKE 1 TABLET BY MOUTH ONCE DAILY 30 tablet 5   No current facility-administered medications for this visit.    Allergies:  Aspirin    Social History:  The patient  reports that she has never smoked. She has never used smokeless tobacco. She reports current alcohol use. She reports that she does not use drugs.   Family History:  The patient's family history includes Breast cancer in her maternal grandmother; Cancer in her paternal grandmother; Heart disease in her father and mother; Hypertension in her father and mother.    ROS:  Please see the history of present illness.   Otherwise, review of systems are positive for none.   All other systems are reviewed and negative.    PHYSICAL EXAM: VS:  BP 138/76 (BP Location: Left Arm, Patient Position: Sitting, Cuff Size: Normal)   Pulse 70   Ht 5\' 2"  (1.575 m)   Wt 147 lb (66.7 kg)   BMI 26.89 kg/m  , BMI Body mass index is 26.89 kg/m. GENERAL:  Well appearing WF in  NAD HEENT:  PERRL, EOMI, sclera are clear. Oropharynx is clear. NECK:  No jugular venous distention, carotid upstroke brisk and symmetric, no bruits, no thyromegaly or adenopathy LUNGS:  Clear to auscultation bilaterally CHEST:  Unremarkable HEART:  RRR,  PMI not displaced or sustained,S1 and S2 within normal limits, no S3, no S4: no clicks, no rubs, gr 1-2/6 systolic murmur RUSB. ABD:  Soft, nontender. BS +, no masses or bruits. No hepatomegaly, no splenomegaly EXT:  2 + pulses throughout, no edema, no cyanosis no clubbing SKIN:  Warm and dry.  No rashes NEURO:  Alert and oriented x 3. Cranial nerves II through XII intact. PSYCH:  Cognitively intact   EKG Interpretation Date/Time:  Friday February 19 2023 15:12:05 EDT Ventricular Rate:  70 PR Interval:  148 QRS Duration:  80 QT Interval:  422 QTC Calculation: 455 R Axis:   31  Text Interpretation: Normal sinus rhythm Normal ECG When compared with ECG of 12-Jul-2009 09:49, No significant change was found Confirmed by [place], Zoe Goonan [id number]) on 02/19/2023 3:14:02 PM     Recent Labs: 09/10/2022: BUN 13; Creatinine, Ser 0.98; Potassium 4.6; Sodium 143    Lipid Panel No results found for: "CHOL", "TRIG", "HDL", "CHOLHDL", "VLDL", "LDLCALC", "LDLDIRECT"    Wt Readings from Last 3 Encounters:  02/19/23 147 lb (66.7 kg)  12/23/21 142 lb (64.4 kg)  07/03/21 138 lb (62.6 kg)      Other studies Reviewed: Additional studies/ records that were reviewed today include:   Stress Echo 05/01/17: Study Conclusions   - Stress ECG conclusions: The stress ECG was normal. - Staged echo: Normal echo stress   Impressions:   - Normal study after maximal exercise.  Coronary Calcium Score   TECHNIQUE: A gated, non-contrast computed tomography scan of the heart was performed using 3mm slice thickness. Axial images were analyzed on a dedicated workstation. Calcium scoring of the coronary arteries was performed using the Agatston method.    FINDINGS: Coronary arteries: Normal origins.   Coronary Calcium Score:   Left main: 0   Left anterior descending artery: 11   Left circumflex artery: 0   Right coronary artery: 0   Total: 11   Percentile: 86th ASSESSMENT AND PLAN:  1.  Palpitations. Chronic. Exacerbated by hormonal changes.  Prior evaluation in 2014 unremarkable. Continue beta blocker therapy.  2.  HTN. BP is well controlled on Bystolic and losartan.  Encourage lifestyle modification with more of a plant based or Mediterranean diet.   3. Hypercholesterolemia. Calcium score is 11. Some aortic calcification. Recommend she try and resume lipitor 10  mg daily.     Follow up one year  Signed, Anavey Coombes [place], MD  02/19/2023 3:23 PM    [facility] [address] [place], [place], [address] Phone [phone], Fax [phone]

## 2023-02-19 ENCOUNTER — Encounter: Payer: Self-pay | Admitting: Cardiology

## 2023-02-19 ENCOUNTER — Ambulatory Visit: Payer: Managed Care, Other (non HMO) | Attending: Cardiology | Admitting: Cardiology

## 2023-02-19 VITALS — BP 138/76 | HR 70 | Ht 62.0 in | Wt 147.0 lb

## 2023-02-19 DIAGNOSIS — I10 Essential (primary) hypertension: Secondary | ICD-10-CM | POA: Diagnosis not present

## 2023-02-19 DIAGNOSIS — R002 Palpitations: Secondary | ICD-10-CM | POA: Diagnosis not present

## 2023-02-19 DIAGNOSIS — I700 Atherosclerosis of aorta: Secondary | ICD-10-CM

## 2023-02-19 DIAGNOSIS — I2510 Atherosclerotic heart disease of native coronary artery without angina pectoris: Secondary | ICD-10-CM

## 2023-02-19 NOTE — Patient Instructions (Signed)
Medication Instructions:  Continue taking medications  *If you need a refill on your cardiac medications before your next appointment, please call your pharmacy*   Lab Work: none  Testing/Procedures: none   Follow-Up: At [facility], you and your health needs are our priority.  As part of our continuing mission to provide you with exceptional heart care, we have created designated Provider Care Teams.  These Care Teams include your primary Cardiologist (physician) and Advanced Practice Providers (APPs -  Physician Assistants and Nurse Practitioners) who all work together to provide you with the care you need, when you need it.  We recommend signing up for the patient portal called "MyChart".  Sign up information is provided on this After Visit Summary.  MyChart is used to connect with patients for Virtual Visits (Telemedicine).  Patients are able to view lab/test results, encounter notes, upcoming appointments, etc.  Non-urgent messages can be sent to your provider as well.   To learn more about what you can do with MyChart, go to [email or web address].    Your next appointment:   Follow up In a 1 year call in June to make an appointment for October 2025   Provider:   [place]

## 2023-02-24 ENCOUNTER — Other Ambulatory Visit (HOSPITAL_COMMUNITY): Payer: Self-pay

## 2023-02-24 MED ORDER — PROGESTERONE 200 MG PO CAPS
200.0000 mg | ORAL_CAPSULE | ORAL | 1 refills | Status: AC
  Filled 2023-02-24: qty 60, 90d supply, fill #0
  Filled 2023-03-10: qty 60, 84d supply, fill #0

## 2023-03-10 ENCOUNTER — Other Ambulatory Visit (HOSPITAL_COMMUNITY): Payer: Self-pay

## 2023-03-19 ENCOUNTER — Other Ambulatory Visit (HOSPITAL_COMMUNITY): Payer: Self-pay

## 2023-03-19 MED ORDER — MONTELUKAST SODIUM 10 MG PO TABS
10.0000 mg | ORAL_TABLET | Freq: Every day | ORAL | 1 refills | Status: DC
  Filled 2023-03-19: qty 90, 90d supply, fill #0
  Filled 2023-08-13: qty 90, 90d supply, fill #1

## 2023-03-22 ENCOUNTER — Other Ambulatory Visit (HOSPITAL_COMMUNITY): Payer: Self-pay

## 2023-03-30 ENCOUNTER — Other Ambulatory Visit (HOSPITAL_COMMUNITY): Payer: Self-pay

## 2023-03-30 MED ORDER — ESTRADIOL 0.1 MG/GM VA CREA
1.0000 g | TOPICAL_CREAM | VAGINAL | 3 refills | Status: AC
  Filled 2023-03-30: qty 42.5, 90d supply, fill #0

## 2023-05-11 ENCOUNTER — Other Ambulatory Visit (HOSPITAL_COMMUNITY): Payer: Self-pay

## 2023-05-11 ENCOUNTER — Other Ambulatory Visit: Payer: Self-pay | Admitting: Cardiology

## 2023-05-11 MED ORDER — LOSARTAN POTASSIUM 50 MG PO TABS
50.0000 mg | ORAL_TABLET | Freq: Every day | ORAL | 2 refills | Status: DC
  Filled 2023-05-11: qty 90, 90d supply, fill #0
  Filled 2023-08-13: qty 90, 90d supply, fill #1
  Filled 2023-11-08: qty 90, 90d supply, fill #2

## 2023-06-16 ENCOUNTER — Other Ambulatory Visit (HOSPITAL_COMMUNITY): Payer: Self-pay

## 2023-06-16 MED ORDER — MOMETASONE FUROATE 200 MCG/ACT IN AERO
2.0000 | INHALATION_SPRAY | Freq: Two times a day (BID) | RESPIRATORY_TRACT | 2 refills | Status: AC
Start: 2023-06-16 — End: ?
  Filled 2023-06-16 – 2023-07-29 (×3): qty 13, 30d supply, fill #0

## 2023-06-17 ENCOUNTER — Other Ambulatory Visit: Payer: Self-pay

## 2023-06-17 ENCOUNTER — Other Ambulatory Visit (HOSPITAL_COMMUNITY): Payer: Self-pay

## 2023-06-18 ENCOUNTER — Other Ambulatory Visit (HOSPITAL_COMMUNITY): Payer: Self-pay

## 2023-06-30 ENCOUNTER — Other Ambulatory Visit (HOSPITAL_COMMUNITY): Payer: Self-pay

## 2023-07-28 ENCOUNTER — Other Ambulatory Visit (HOSPITAL_COMMUNITY): Payer: Self-pay

## 2023-07-29 ENCOUNTER — Other Ambulatory Visit (HOSPITAL_COMMUNITY): Payer: Self-pay

## 2023-08-10 ENCOUNTER — Other Ambulatory Visit (HOSPITAL_COMMUNITY): Payer: Self-pay

## 2023-08-10 MED ORDER — AIRSUPRA 90-80 MCG/ACT IN AERO
2.0000 | INHALATION_SPRAY | RESPIRATORY_TRACT | 2 refills | Status: AC | PRN
  Filled 2023-08-10: qty 10.7, 17d supply, fill #0

## 2023-10-11 ENCOUNTER — Other Ambulatory Visit (HOSPITAL_COMMUNITY): Payer: Self-pay

## 2023-10-11 MED ORDER — MONTELUKAST SODIUM 10 MG PO TABS
10.0000 mg | ORAL_TABLET | Freq: Every day | ORAL | 0 refills | Status: DC
  Filled 2023-10-11 – 2024-01-25 (×3): qty 90, 90d supply, fill #0

## 2023-10-12 ENCOUNTER — Other Ambulatory Visit (HOSPITAL_COMMUNITY): Payer: Self-pay

## 2023-10-18 ENCOUNTER — Other Ambulatory Visit (HOSPITAL_COMMUNITY): Payer: Self-pay

## 2023-11-16 LAB — LAB REPORT - SCANNED: EGFR: 84

## 2024-01-25 ENCOUNTER — Other Ambulatory Visit (HOSPITAL_COMMUNITY): Payer: Self-pay

## 2024-01-25 ENCOUNTER — Other Ambulatory Visit: Payer: Self-pay

## 2024-01-27 ENCOUNTER — Other Ambulatory Visit (HOSPITAL_COMMUNITY): Payer: Self-pay

## 2024-01-27 ENCOUNTER — Telehealth: Payer: Self-pay | Admitting: Cardiology

## 2024-01-27 MED ORDER — LOSARTAN POTASSIUM 50 MG PO TABS
50.0000 mg | ORAL_TABLET | Freq: Every day | ORAL | 0 refills | Status: DC
  Filled 2024-01-27: qty 90, 90d supply, fill #0

## 2024-01-27 MED ORDER — NEBIVOLOL HCL 10 MG PO TABS
10.0000 mg | ORAL_TABLET | Freq: Every day | ORAL | 0 refills | Status: DC
  Filled 2024-01-27: qty 90, 90d supply, fill #0

## 2024-01-27 NOTE — Telephone Encounter (Signed)
*  STAT* If patient is at the pharmacy, call can be transferred to refill team.   1. Which medications need to be refilled? (please list name of each medication and dose if known)  nebivolol  (BYSTOLIC ) 10 MG tablet  losartan  (COZAAR ) 50 MG tablet   2. Would you like to learn more about the convenience, safety, & potential cost savings by using the [facility]? No   3. Are you open to using the [facility] (Type [facility]. No   4. Which pharmacy/location (including street and city if local pharmacy) is medication to be sent to? [place] [facility]     5. Do they need a 30 day or 90 day supply? 90 day   Pt has appt. Scheduled 11/18.

## 2024-01-27 NOTE — Telephone Encounter (Signed)
 Pt's medications were sent to pt's pharmacy as requested. Confirmation received.

## 2024-02-01 ENCOUNTER — Other Ambulatory Visit: Payer: Self-pay | Admitting: Obstetrics and Gynecology

## 2024-02-01 DIAGNOSIS — Z1231 Encounter for screening mammogram for malignant neoplasm of breast: Secondary | ICD-10-CM

## 2024-03-27 NOTE — Progress Notes (Signed)
 Cardiology Office Note   Date:  03/31/2024   ID:  Faith Miller, DOB 1965-12-04, MRN [id number]  PCP:  Valma Carwin, MD  Cardiologist:   Antonia Culbertson, MD   Chief Complaint  Patient presents with   Hyperlipidemia   Hypertension      History of Present Illness: Faith Miller is a 58 y.o. female who is seen for follow up  of HTN and palpitations. She has a history of asthma, HTN and HLD. She was seen initially in January 2014 for evaluation of palpitations. Event monitor was normal. Echo done in 2013 showed mild mitral regurgitation, otherwise normal.   She was taking a diuretic for BP but this made her feel fatigued- like wearing a lead suit. She was having more palpitations. We switched her to Bystolic  and she felt much better. Palpitations were well controlled.   Positive family history with father dying of MI in early 73s. She had a normal stress Echo in 2018.   She had a coronary calcium  score last year of 11. We did recommend she go on statin therapy. She states she had heartburn with this and quit taking it. Reports he last cholesterol was 180. Palpitations still occur with cycle but improved since she is menopausal.     Past Medical History:  Diagnosis Date   Asthma    History of elevated lipids    Hypertension    IC (interstitial cystitis)     Past Surgical History:  Procedure Laterality Date   GALLBLADDER SURGERY     WISDOM TOOTH EXTRACTION       Current Outpatient Medications  Medication Sig Dispense Refill   albuterol  (PROVENTIL  HFA;VENTOLIN  HFA) 108 (90 Base) MCG/ACT inhaler Inhale 2 puffs into the lungs every 6 (six) hours as needed for wheezing or shortness of breath.     Albuterol  Sulfate, sensor, (PROAIR  DIGIHALER) 108 (90 Base) MCG/ACT AEPB Inhale 2 puffs into the lungs every 4-6 hours as needed. 1 each 2   Albuterol -Budesonide  (AIRSUPRA ) 90-80 MCG/ACT AERO Inhale 2 puffs into the lungs as needed; not to exceed 6 doses per day 10.7 g 2    estradiol  (ESTRACE ) 0.1 MG/GM vaginal cream Place 1 g vaginally 3 (three) times a week. 42.5 g 3   ibuprofen (ADVIL,MOTRIN) 200 MG tablet Take 200 mg by mouth every 6 (six) hours as needed for moderate pain.     Mometasone  Furoate (ASMANEX  HFA) 200 MCG/ACT AERO Inhale 2 puffs into the lungs in the morning and in the evening 13 g 2   montelukast  (SINGULAIR ) 10 MG tablet Take 1 tablet (10 mg total) by mouth daily. 90 tablet 0   progesterone  (PROMETRIUM ) 200 MG capsule Take one capsule by mouth and place one capsule vaginally each night at bedtime for 10 days each month 60 capsule 1   losartan  (COZAAR ) 50 MG tablet Take 1 tablet (50 mg total) by mouth daily. 90 tablet 3   nebivolol  (BYSTOLIC ) 10 MG tablet Take 1 tablet (10 mg total) by mouth daily. 90 tablet 3   No current facility-administered medications for this visit.    Allergies:   Aspirin    Social History:  The patient  reports that she has never smoked. She has never used smokeless tobacco. She reports current alcohol use. She reports that she does not use drugs.   Family History:  The patient's family history includes Breast cancer in her maternal grandmother; Cancer in her paternal grandmother; Heart disease in her father and mother; Hypertension  in her father and mother.    ROS:  Please see the history of present illness.   Otherwise, review of systems are positive for none.   All other systems are reviewed and negative.    PHYSICAL EXAM: VS:  BP 118/70 (BP Location: Right Arm, Patient Position: Sitting, Cuff Size: Normal)   Pulse 62   Ht 5' 4 (1.626 m)   Wt 145 lb (65.8 kg)   SpO2 97%   BMI 24.89 kg/m  , BMI Body mass index is 24.89 kg/m. GENERAL:  Well appearing WF in NAD HEENT:  PERRL, EOMI, sclera are clear. Oropharynx is clear. NECK:  No jugular venous distention, carotid upstroke brisk and symmetric, no bruits, no thyromegaly or adenopathy LUNGS:  Clear to auscultation bilaterally CHEST:  Unremarkable HEART:   RRR,  PMI not displaced or sustained,S1 and S2 within normal limits, no S3, no S4: no clicks, no rubs, gr 1-2/6 systolic murmur RUSB. ABD:  Soft, nontender. BS +, no masses or bruits. No hepatomegaly, no splenomegaly EXT:  2 + pulses throughout, no edema, no cyanosis no clubbing SKIN:  Warm and dry.  No rashes NEURO:  Alert and oriented x 3. Cranial nerves II through XII intact. PSYCH:  Cognitively intact   EKG Interpretation Date/Time:  Friday March 31 2024 08:09:00 EST Ventricular Rate:  62 PR Interval:  150 QRS Duration:  84 QT Interval:  436 QTC Calculation: 442 R Axis:   59  Text Interpretation: Normal sinus rhythm Normal ECG When compared with ECG of 19-Feb-2023 15:12, No significant change was found Confirmed by Shedrick Sarli [id number]) on 03/31/2024 8:14:24 AM     Recent Labs: No results found for requested labs within last 365 days.    Lipid Panel No results found for: CHOL, TRIG, HDL, CHOLHDL, VLDL, LDLCALC, LDLDIRECT    Wt Readings from Last 3 Encounters:  03/31/24 145 lb (65.8 kg)  02/19/23 147 lb (66.7 kg)  12/23/21 142 lb (64.4 kg)    EKG Interpretation Date/Time:  Friday March 31 2024 08:09:00 EST Ventricular Rate:  62 PR Interval:  150 QRS Duration:  84 QT Interval:  436 QTC Calculation: 442 R Axis:   59  Text Interpretation: Normal sinus rhythm Normal ECG When compared with ECG of 19-Feb-2023 15:12, No significant change was found Confirmed by Rakiyah Esch [phone]) on 03/31/2024 8:14:24 AM    Other studies Reviewed: Additional studies/ records that were reviewed today include:   Stress Echo 05/01/17: Study Conclusions   - Stress ECG conclusions: The stress ECG was normal. - Staged echo: Normal echo stress   Impressions:   - Normal study after maximal exercise.  Coronary Calcium  Score   TECHNIQUE: A gated, non-contrast computed tomography scan of the heart was performed using 3mm slice thickness. Axial images were analyzed  on a dedicated workstation. Calcium  scoring of the coronary arteries was performed using the Agatston method.   FINDINGS: Coronary arteries: Normal origins.   Coronary Calcium  Score:   Left main: 0   Left anterior descending artery: 11   Left circumflex artery: 0   Right coronary artery: 0   Total: 11   Percentile: 86th ASSESSMENT AND PLAN:  1.  Palpitations. Chronic. Exacerbated by hormonal changes.  Prior evaluation in 2014 unremarkable. Continue beta blocker therapy.  2.  HTN. BP is well controlled on Bystolic  and losartan .  Refilled.   3. Hypercholesterolemia. Calcium  score is 11. Some aortic calcification. Recommend she try and resume lipitor 10 will request copy of last lab. She  does not want to take statin    Follow up one year  Signed, Prue Lingenfelter, MD  03/31/2024 8:20 AM    [facility] [address], [place], [place], [address] Phone [phone], Fax [phone]

## 2024-03-28 ENCOUNTER — Ambulatory Visit: Admitting: Cardiology

## 2024-03-31 ENCOUNTER — Other Ambulatory Visit (HOSPITAL_COMMUNITY): Payer: Self-pay

## 2024-03-31 ENCOUNTER — Encounter: Payer: Self-pay | Admitting: Cardiology

## 2024-03-31 ENCOUNTER — Ambulatory Visit: Attending: Cardiology | Admitting: Cardiology

## 2024-03-31 VITALS — BP 118/70 | HR 62 | Ht 64.0 in | Wt 145.0 lb

## 2024-03-31 DIAGNOSIS — I2510 Atherosclerotic heart disease of native coronary artery without angina pectoris: Secondary | ICD-10-CM

## 2024-03-31 DIAGNOSIS — I700 Atherosclerosis of aorta: Secondary | ICD-10-CM | POA: Diagnosis not present

## 2024-03-31 DIAGNOSIS — I10 Essential (primary) hypertension: Secondary | ICD-10-CM | POA: Diagnosis not present

## 2024-03-31 DIAGNOSIS — R002 Palpitations: Secondary | ICD-10-CM | POA: Diagnosis not present

## 2024-03-31 MED ORDER — NEBIVOLOL HCL 10 MG PO TABS
10.0000 mg | ORAL_TABLET | Freq: Every day | ORAL | 3 refills | Status: AC
  Filled 2024-03-31 – 2024-05-08 (×2): qty 90, 90d supply, fill #0

## 2024-03-31 MED ORDER — LOSARTAN POTASSIUM 50 MG PO TABS
50.0000 mg | ORAL_TABLET | Freq: Every day | ORAL | 3 refills | Status: AC
  Filled 2024-03-31 – 2024-05-08 (×2): qty 90, 90d supply, fill #0

## 2024-03-31 NOTE — Patient Instructions (Signed)
 Medication Instructions:  Continue same medications *If you need a refill on your cardiac medications before your next appointment, please call your pharmacy*  Lab Work: None ordered  Testing/Procedures: None ordered  Follow-Up: At [facility], you and your health needs are our priority.  As part of our continuing mission to provide you with exceptional heart care, our providers are all part of one team.  This team includes your primary Cardiologist (physician) and Advanced Practice Providers or APPs (Physician Assistants and Nurse Practitioners) who all work together to provide you with the care you need, when you need it.  Your next appointment:  1 year     Call in August to schedule Nov appointment     Provider:  [name]   We recommend signing up for the patient portal called MyChart.  Sign up information is provided on this After Visit Summary.  MyChart is used to connect with patients for Virtual Visits (Telemedicine).  Patients are able to view lab/test results, encounter notes, upcoming appointments, etc.  Non-urgent messages can be sent to your provider as well.   To learn more about what you can do with MyChart, go to [email or web address].

## 2024-04-25 ENCOUNTER — Other Ambulatory Visit (HOSPITAL_COMMUNITY): Payer: Self-pay

## 2024-04-25 MED ORDER — MONTELUKAST SODIUM 10 MG PO TABS
10.0000 mg | ORAL_TABLET | Freq: Every day | ORAL | 1 refills | Status: AC
  Filled 2024-04-25: qty 90, 90d supply, fill #0

## 2024-04-26 ENCOUNTER — Other Ambulatory Visit (HOSPITAL_COMMUNITY): Payer: Self-pay

## 2024-04-27 ENCOUNTER — Inpatient Hospital Stay
Admission: RE | Admit: 2024-04-27 | Discharge: 2024-04-27 | Attending: Obstetrics and Gynecology | Admitting: Obstetrics and Gynecology

## 2024-04-27 DIAGNOSIS — Z1231 Encounter for screening mammogram for malignant neoplasm of breast: Secondary | ICD-10-CM

## 2024-05-09 ENCOUNTER — Other Ambulatory Visit (HOSPITAL_COMMUNITY): Payer: Self-pay

## 2024-05-10 ENCOUNTER — Other Ambulatory Visit (HOSPITAL_COMMUNITY): Payer: Self-pay

## 2024-06-13 ENCOUNTER — Encounter: Payer: Self-pay | Admitting: Cardiology
